# Patient Record
Sex: Female | Born: 1970 | Race: White | Hispanic: No | Marital: Single | Smoking: Current every day smoker
Health system: Southern US, Community
[De-identification: ages and names within clinical notes are randomized; demographics above are authoritative.]

## PROBLEM LIST (undated history)

## (undated) DIAGNOSIS — I10 Essential (primary) hypertension: Secondary | ICD-10-CM

## (undated) DIAGNOSIS — N390 Urinary tract infection, site not specified: Secondary | ICD-10-CM

## (undated) DIAGNOSIS — C539 Malignant neoplasm of cervix uteri, unspecified: Secondary | ICD-10-CM

## (undated) HISTORY — PX: ANAL FISSURE REPAIR: SHX2312

---
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
## 2004-12-19 ENCOUNTER — Emergency Department: Payer: Self-pay | Admitting: Emergency Medicine

## 2006-11-28 ENCOUNTER — Emergency Department: Payer: Self-pay | Admitting: Emergency Medicine

## 2007-04-26 ENCOUNTER — Emergency Department: Payer: Self-pay | Admitting: Emergency Medicine

## 2007-09-11 ENCOUNTER — Emergency Department: Payer: Self-pay | Admitting: Emergency Medicine

## 2007-10-29 ENCOUNTER — Emergency Department: Payer: Self-pay | Admitting: Emergency Medicine

## 2009-08-12 ENCOUNTER — Emergency Department: Payer: Self-pay | Admitting: Emergency Medicine

## 2010-02-09 ENCOUNTER — Ambulatory Visit: Payer: Self-pay | Admitting: Family Medicine

## 2010-05-24 NOTE — Assessment & Plan Note (Signed)
Summary: FLU SHOT/EVM  Nurse Visit    Immunizations Administered:  Influenza Vaccine:    Vaccine Type: FLUMIST    Site: NASAL    Mfr: MEDIMMUNE    Dose: 0.2mg     Route: INTRANASAL    Given by: Levonne Spiller EMT-P    Exp. Date: 03/20/2010    Lot #: [id number]    VIS given: 11/16/09 version given February 09, 2010.   Immunizations Administered:  Influenza Vaccine:    Vaccine Type: FLUMIST    Site: NASAL    Mfr: MEDIMMUNE    Dose: 0.2mg     Route: INTRANASAL    Given by: Levonne Spiller EMT-P    Exp. Date: 03/20/2010    Lot #: [id number]    VIS given: 11/16/09 version given February 09, 2010.  Flu Vaccine Consent Questions:    Do you have a history of severe allergic reactions to this vaccine? no    Any prior history of allergic reactions to egg and/or gelatin? no    Do you have a sensitivity to the preservative Thimersol? no    Do you have a past history of Guillan-Barre Syndrome? no    Do you currently have an acute febrile illness? no    Have you ever had a severe reaction to latex? no    Vaccine information given and explained to patient? yes    Are you currently pregnant? no

## 2011-04-27 ENCOUNTER — Emergency Department: Payer: Self-pay | Admitting: Emergency Medicine

## 2011-04-28 LAB — URINALYSIS, COMPLETE
Bilirubin,UR: NEGATIVE
Glucose,UR: NEGATIVE mg/dL (ref 0–75)
Nitrite: POSITIVE
Ph: 5 (ref 4.5–8.0)
Protein: 100
RBC,UR: 22 /HPF (ref 0–5)
Specific Gravity: 1.027 (ref 1.003–1.030)
Squamous Epithelial: 41

## 2013-07-24 ENCOUNTER — Emergency Department: Payer: Self-pay | Admitting: Emergency Medicine

## 2013-07-24 LAB — URINALYSIS, COMPLETE
Bacteria: NONE SEEN
Bilirubin,UR: NEGATIVE
Glucose,UR: NEGATIVE mg/dL (ref 0–75)
KETONE: NEGATIVE
Nitrite: NEGATIVE
PROTEIN: NEGATIVE
Ph: 6 (ref 4.5–8.0)
RBC,UR: 5 /HPF (ref 0–5)
Specific Gravity: 1.005 (ref 1.003–1.030)
WBC UR: 46 /HPF (ref 0–5)

## 2013-07-26 LAB — URINE CULTURE

## 2013-07-27 ENCOUNTER — Emergency Department: Payer: Self-pay | Admitting: Emergency Medicine

## 2013-07-27 LAB — URINALYSIS, COMPLETE
Bacteria: NONE SEEN
Bilirubin,UR: NEGATIVE
Blood: NEGATIVE
Glucose,UR: NEGATIVE mg/dL (ref 0–75)
Ketone: NEGATIVE
Leukocyte Esterase: NEGATIVE
Nitrite: NEGATIVE
Ph: 6 (ref 4.5–8.0)
Protein: NEGATIVE
RBC,UR: 6 /HPF (ref 0–5)
Specific Gravity: 1.014 (ref 1.003–1.030)
Squamous Epithelial: 4
WBC UR: <1 /HPF (ref 0–5)

## 2013-07-27 LAB — BASIC METABOLIC PANEL
Anion Gap: 7 (ref 7–16)
BUN: 8 mg/dL (ref 7–18)
Calcium, Total: 8.4 mg/dL — ABNORMAL LOW (ref 8.5–10.1)
Chloride: 108 mmol/L — ABNORMAL HIGH (ref 98–107)
Co2: 24 mmol/L (ref 21–32)
Creatinine: 0.94 mg/dL (ref 0.60–1.30)
EGFR (African American): >60
EGFR (Non-African Amer.): >60
Glucose: 108 mg/dL — ABNORMAL HIGH (ref 65–99)
Osmolality: 276 (ref 275–301)
Potassium: 3.6 mmol/L (ref 3.5–5.1)
Sodium: 139 mmol/L (ref 136–145)

## 2013-07-27 LAB — CBC
HCT: 39.4 % (ref 35.0–47.0)
HGB: 13.1 g/dL (ref 12.0–16.0)
MCH: 30.9 pg (ref 26.0–34.0)
MCHC: 33.3 g/dL (ref 32.0–36.0)
MCV: 93 fL (ref 80–100)
PLATELETS: 288 10*3/uL (ref 150–440)
RBC: 4.24 10*6/uL (ref 3.80–5.20)
RDW: 13.2 % (ref 11.5–14.5)
WBC: 10.5 10*3/uL (ref 3.6–11.0)

## 2013-07-28 LAB — PREGNANCY, URINE: Pregnancy Test, Urine: NEGATIVE m[IU]/mL

## 2013-07-30 ENCOUNTER — Emergency Department: Payer: Self-pay | Admitting: Emergency Medicine

## 2013-07-30 LAB — CBC
HCT: 39.3 % (ref 35.0–47.0)
HGB: 13.4 g/dL (ref 12.0–16.0)
MCH: 31.4 pg (ref 26.0–34.0)
MCHC: 34 g/dL (ref 32.0–36.0)
MCV: 92 fL (ref 80–100)
Platelet: 277 10*3/uL (ref 150–440)
RBC: 4.26 10*6/uL (ref 3.80–5.20)
RDW: 13.4 % (ref 11.5–14.5)
WBC: 8 10*3/uL (ref 3.6–11.0)

## 2013-07-30 LAB — URINALYSIS, COMPLETE
Bilirubin,UR: NEGATIVE
GLUCOSE, UR: NEGATIVE mg/dL (ref 0–75)
Ketone: NEGATIVE
Leukocyte Esterase: NEGATIVE
Nitrite: NEGATIVE
PROTEIN: NEGATIVE
Ph: 5 (ref 4.5–8.0)
RBC,UR: 6 /HPF (ref 0–5)
SPECIFIC GRAVITY: 1.023 (ref 1.003–1.030)

## 2013-07-30 LAB — COMPREHENSIVE METABOLIC PANEL
ALK PHOS: 68 U/L
ANION GAP: 6 — AB (ref 7–16)
Albumin: 3.5 g/dL (ref 3.4–5.0)
BILIRUBIN TOTAL: 0.4 mg/dL (ref 0.2–1.0)
BUN: 10 mg/dL (ref 7–18)
CO2: 22 mmol/L (ref 21–32)
Calcium, Total: 8.4 mg/dL — ABNORMAL LOW (ref 8.5–10.1)
Chloride: 108 mmol/L — ABNORMAL HIGH (ref 98–107)
Creatinine: 0.66 mg/dL (ref 0.60–1.30)
Glucose: 125 mg/dL — ABNORMAL HIGH (ref 65–99)
Osmolality: 272 (ref 275–301)
Potassium: 4.4 mmol/L (ref 3.5–5.1)
SGOT(AST): 23 U/L (ref 15–37)
SGPT (ALT): 20 U/L (ref 12–78)
SODIUM: 136 mmol/L (ref 136–145)
Total Protein: 7.4 g/dL (ref 6.4–8.2)

## 2013-10-03 ENCOUNTER — Emergency Department: Payer: Self-pay | Admitting: Emergency Medicine

## 2013-10-03 LAB — COMPREHENSIVE METABOLIC PANEL
Albumin: 3.4 g/dL (ref 3.4–5.0)
Alkaline Phosphatase: 65 U/L
Anion Gap: 6 — ABNORMAL LOW (ref 7–16)
BILIRUBIN TOTAL: 0.4 mg/dL (ref 0.2–1.0)
BUN: 14 mg/dL (ref 7–18)
CALCIUM: 8.3 mg/dL — AB (ref 8.5–10.1)
CREATININE: 1.11 mg/dL (ref 0.60–1.30)
Chloride: 109 mmol/L — ABNORMAL HIGH (ref 98–107)
Co2: 24 mmol/L (ref 21–32)
EGFR (African American): >60
GLUCOSE: 124 mg/dL — AB (ref 65–99)
Osmolality: 279 (ref 275–301)
Potassium: 3.1 mmol/L — ABNORMAL LOW (ref 3.5–5.1)
SGOT(AST): 15 U/L (ref 15–37)
SGPT (ALT): 15 U/L (ref 12–78)
SODIUM: 139 mmol/L (ref 136–145)
TOTAL PROTEIN: 6.5 g/dL (ref 6.4–8.2)

## 2013-10-04 LAB — URINALYSIS, COMPLETE
BILIRUBIN, UR: NEGATIVE
Bacteria: NONE SEEN
GLUCOSE, UR: NEGATIVE mg/dL (ref 0–75)
Ketone: NEGATIVE
NITRITE: NEGATIVE
Ph: 6 (ref 4.5–8.0)
Protein: NEGATIVE
SPECIFIC GRAVITY: 1.023 (ref 1.003–1.030)
Squamous Epithelial: 4

## 2013-10-04 LAB — CBC WITH DIFFERENTIAL/PLATELET
Basophil #: 0.1 10*3/uL (ref 0.0–0.1)
Basophil %: 0.5 %
Eosinophil #: 0.1 10*3/uL (ref 0.0–0.7)
Eosinophil %: 1.1 %
HCT: 38.2 % (ref 35.0–47.0)
HGB: 12.9 g/dL (ref 12.0–16.0)
Lymphocyte #: 4.5 10*3/uL — ABNORMAL HIGH (ref 1.0–3.6)
Lymphocyte %: 42.2 %
MCH: 31.5 pg (ref 26.0–34.0)
MCHC: 33.8 g/dL (ref 32.0–36.0)
MCV: 93 fL (ref 80–100)
MONOS PCT: 5.6 %
Monocyte #: 0.6 x10 3/mm (ref 0.2–0.9)
Neutrophil #: 5.3 10*3/uL (ref 1.4–6.5)
Neutrophil %: 50.6 %
PLATELETS: 258 10*3/uL (ref 150–440)
RBC: 4.1 10*6/uL (ref 3.80–5.20)
RDW: 13.7 % (ref 11.5–14.5)
WBC: 10.5 10*3/uL (ref 3.6–11.0)

## 2013-10-04 LAB — GC/CHLAMYDIA PROBE AMP

## 2013-10-04 LAB — WET PREP, GENITAL

## 2013-11-29 ENCOUNTER — Emergency Department: Payer: Self-pay | Admitting: Emergency Medicine

## 2013-11-29 LAB — URINALYSIS, COMPLETE
Bacteria: NONE SEEN
Bilirubin,UR: NEGATIVE
Glucose,UR: NEGATIVE mg/dL (ref 0–75)
KETONE: NEGATIVE
Leukocyte Esterase: NEGATIVE
NITRITE: NEGATIVE
PH: 6 (ref 4.5–8.0)
PROTEIN: NEGATIVE
RBC,UR: 6 /HPF (ref 0–5)
Specific Gravity: 1.017 (ref 1.003–1.030)
WBC UR: 1 /HPF (ref 0–5)

## 2013-11-29 LAB — GC/CHLAMYDIA PROBE AMP

## 2013-11-29 LAB — WET PREP, GENITAL

## 2013-12-01 ENCOUNTER — Ambulatory Visit: Payer: Self-pay

## 2013-12-01 LAB — URINE CULTURE

## 2013-12-08 ENCOUNTER — Ambulatory Visit: Payer: Self-pay

## 2013-12-10 ENCOUNTER — Ambulatory Visit: Payer: Self-pay

## 2014-05-25 ENCOUNTER — Emergency Department: Payer: Self-pay | Admitting: Emergency Medicine

## 2014-06-10 ENCOUNTER — Ambulatory Visit: Payer: Self-pay

## 2014-08-07 ENCOUNTER — Other Ambulatory Visit: Payer: Self-pay | Admitting: Oncology

## 2014-08-07 DIAGNOSIS — N6320 Unspecified lump in the left breast, unspecified quadrant: Secondary | ICD-10-CM

## 2014-08-17 ENCOUNTER — Emergency Department
Admit: 2014-08-17 | Disposition: A | Discharge status: Home / Self Care | Payer: Self-pay | Admitting: Emergency Medicine

## 2014-12-09 ENCOUNTER — Other Ambulatory Visit: Payer: Self-pay

## 2014-12-09 ENCOUNTER — Ambulatory Visit: Payer: Self-pay

## 2014-12-22 ENCOUNTER — Encounter: Payer: Self-pay | Admitting: Emergency Medicine

## 2014-12-22 ENCOUNTER — Emergency Department
Admission: EM | Admit: 2014-12-22 | Discharge: 2014-12-22 | Disposition: A | Discharge status: Home / Self Care | Payer: Self-pay | Attending: Emergency Medicine | Admitting: Emergency Medicine

## 2014-12-22 DIAGNOSIS — N390 Urinary tract infection, site not specified: Secondary | ICD-10-CM | POA: Insufficient documentation

## 2014-12-22 DIAGNOSIS — Z720 Tobacco use: Secondary | ICD-10-CM | POA: Insufficient documentation

## 2014-12-22 HISTORY — DX: Malignant neoplasm of cervix uteri, unspecified: C53.9

## 2014-12-22 HISTORY — DX: Urinary tract infection, site not specified: N39.0

## 2014-12-22 LAB — URINALYSIS COMPLETE WITH MICROSCOPIC (ARMC ONLY)
BACTERIA UA: NONE SEEN
Bilirubin Urine: NEGATIVE
Glucose, UA: NEGATIVE mg/dL
Ketones, ur: NEGATIVE mg/dL
NITRITE: POSITIVE — AB
PH: 6 (ref 5.0–8.0)
Protein, ur: NEGATIVE mg/dL
Specific Gravity, Urine: 1.003 — ABNORMAL LOW (ref 1.005–1.030)

## 2014-12-22 MED ORDER — CEPHALEXIN 500 MG PO CAPS: 500.0000 mg | ORAL_CAPSULE | Freq: Two times a day (BID) | ORAL | Status: AC

## 2014-12-22 NOTE — ED Provider Notes (Signed)
[facility] Emergency Department Provider Note ____________________________________________  Time seen: 1602  I have reviewed the triage vital signs and the nursing notes.  HISTORY  Chief Complaint  Urinary Frequency  HPI Stephanie Franklin is a 44 y.o. female was to the ED with urinary frequency and bladder pressure since this morning. She denies any nausea, vomiting, flank pain, fevers, chills, or sweats. She also denies any frank hematuria  Past Medical History  Diagnosis Date  . Cervical ca   . UTI (urinary tract infection)    There are no active problems to display for this patient.  Past Surgical History  Procedure Laterality Date  . Cesarean section    . Anal fissure repair     Current Outpatient Rx  Name  Route  Sig  Dispense  Refill  . cephALEXin (KEFLEX) 500 MG capsule   Oral   Take 1 capsule (500 mg total) by mouth 2 (two) times daily.   14 capsule   0     Allergies Review of patient's allergies indicates no known allergies.  History reviewed. No pertinent family history.  Social History Social History  Substance Use Topics  . Smoking status: Current Every Day Smoker  . Smokeless tobacco: None  . Alcohol Use: No   Review of Systems  Constitutional: Negative for fever. Eyes: Negative for visual changes. ENT: Negative for sore throat. Cardiovascular: Negative for chest pain. Respiratory: Negative for shortness of breath. Gastrointestinal: Negative for abdominal pain, vomiting and diarrhea. Genitourinary: Negative for dysuria. Reports urinary frequency.  Musculoskeletal: Negative for back pain. Skin: Negative for rash. Neurological: Negative for headaches, focal weakness or numbness. ____________________________________________  PHYSICAL EXAM:  VITAL SIGNS: ED Triage Vitals  Enc Vitals Group     BP 12/22/14 1441 167/90 mmHg     Pulse Rate 12/22/14 1441 100     Resp 12/22/14 1441 18     Temp 12/22/14 1441 98.1 F  (36.7 C)     Temp Source 12/22/14 1441 Oral     SpO2 12/22/14 1441 95 %     Weight 12/22/14 1441 170 lb (77.111 kg)     Height 12/22/14 1441 5\' 1"  (1.549 m)     Head Cir --      Peak Flow --      Pain Score 12/22/14 1442 0     Pain Loc --      Pain Edu? --      Excl. in [place]? --    Constitutional: Alert and oriented. Well appearing and in no distress. Eyes: Conjunctivae are normal. PERRL. Normal extraocular movements. ENT   Head: Normocephalic and atraumatic.   Nose: No congestion/rhinnorhea.   Mouth/Throat: Mucous membranes are moist.   Neck: Supple. No thyromegaly. Hematological/Lymphatic/Immunilogical: No cervical lymphadenopathy. Cardiovascular: Normal rate, regular rhythm.  Respiratory: Normal respiratory effort. No wheezes/rales/rhonchi. Gastrointestinal: Soft and nontender. No distention. Musculoskeletal: Nontender with normal range of motion in all extremities.  Neurologic:  Normal gait without ataxia. Normal speech and language. No gross focal neurologic deficits are appreciated. Skin:  Skin is warm, dry and intact. No rash noted. Psychiatric: Mood and affect are normal. Patient exhibits appropriate insight and judgment. ____________________________________________   LABS (pertinent positives/negatives) Labs Reviewed  URINALYSIS COMPLETEWITH MICROSCOPIC ([facility] ONLY) - Abnormal; Notable for the following:    Color, Urine AMBER (*)    APPearance CLEAR (*)    Specific Gravity, Urine 1.003 (*)    Hgb urine dipstick 2+ (*)    Nitrite POSITIVE (*)  Leukocytes, UA TRACE (*)    Squamous Epithelial / LPF 0-5 (*)    All other components within normal limits  ____________________________________________  INITIAL IMPRESSION / ASSESSMENT AND PLAN / ED COURSE  UTI confirmed by urinalysis. Treatment with Keflex x 7 days. Follow-up with primary provider as needed.  ____________________________________________  FINAL CLINICAL IMPRESSION(S) / ED DIAGNOSES  Final  diagnoses:  UTI (lower urinary tract infection)     Melvenia Needles, PA-C 12/22/14 1621  Orbie Pyo, MD 12/26/14 1600

## 2014-12-22 NOTE — Discharge Instructions (Signed)

## 2014-12-22 NOTE — ED Notes (Signed)
Reports urinary frequency and pressure onset this am.  Skin w/d, denies abd pain.

## 2014-12-30 ENCOUNTER — Ambulatory Visit: Payer: Self-pay | Attending: Oncology | Admitting: *Deleted

## 2014-12-30 ENCOUNTER — Encounter: Payer: Self-pay | Admitting: *Deleted

## 2014-12-30 ENCOUNTER — Encounter (INDEPENDENT_AMBULATORY_CARE_PROVIDER_SITE_OTHER): Payer: Self-pay

## 2014-12-30 ENCOUNTER — Other Ambulatory Visit: Payer: Self-pay | Admitting: Oncology

## 2014-12-30 ENCOUNTER — Other Ambulatory Visit: Payer: Self-pay | Admitting: *Deleted

## 2014-12-30 ENCOUNTER — Ambulatory Visit
Admission: RE | Admit: 2014-12-30 | Discharge: 2014-12-30 | Disposition: A | Discharge status: Home / Self Care | Payer: Self-pay | Source: Ambulatory Visit | Attending: Oncology | Admitting: Oncology

## 2014-12-30 VITALS — BP 138/96 | HR 103 | Temp 97.3°F | Ht 62.0 in | Wt 174.2 lb

## 2014-12-30 DIAGNOSIS — N6320 Unspecified lump in the left breast, unspecified quadrant: Secondary | ICD-10-CM

## 2014-12-30 DIAGNOSIS — Z0000 Encounter for general adult medical examination without abnormal findings: Secondary | ICD-10-CM

## 2014-12-30 DIAGNOSIS — N63 Unspecified lump in unspecified breast: Secondary | ICD-10-CM

## 2014-12-30 NOTE — Progress Notes (Signed)
Subjective:     Patient ID: Stephanie Franklin, female   DOB: May 21, 1970, 44 y.o.   MRN: [id number]  HPI   Review of Systems     Objective:   Physical Exam  Pulmonary/Chest: Right breast exhibits no inverted nipple, no mass, no nipple discharge, no skin change and no tenderness. Left breast exhibits no inverted nipple, no mass, no nipple discharge, no skin change and no tenderness. Breasts are symmetrical.  Slight induration noted under bilateral nipples  Abdominal: There is no splenomegaly or hepatomegaly.    Genitourinary: No labial fusion. There is no rash, tenderness, lesion or injury on the right labia. There is no rash, tenderness, lesion or injury on the left labia. Cervix exhibits no motion tenderness and no friability. Right adnexum displays no mass, no tenderness and no fullness. Left adnexum displays no mass, no tenderness and no fullness. No erythema, tenderness or bleeding in the vagina. No foreign body around the vagina. No signs of injury around the vagina. No vaginal discharge found.    Vaginal dryness noted       Assessment:     44 year old white female returns to [facility] for annual screening.  Clinical breast exam unremarkable.  Taught self breast awareness.  Last mammo 06/10/14 birads 3.  Due for 6 month follow up and annual exam.  Patient with history of cervical cancer.  Vaginal dryness noted.  Discussed menopausal symptoms.  Patient has been screened for eligibility.  She does not have any insurance, Medicare or Medicaid.  She also meets financial eligibility.  Hand-out given on the Affordable Care Act.    Plan:     Bilateral diagnostic mammogram and ultrasound ordered.  Will follow up per protocol.  To try a vaginal moisturized and lubricant.  She is agreeable.  Will follow up per protocol.

## 2014-12-30 NOTE — Patient Instructions (Signed)
Gave patient hand-out, Women Staying Healthy, Active and Well from [facility], with education on breast health, pap smears, heart and colon health. 

## 2015-01-04 ENCOUNTER — Encounter: Payer: Self-pay | Admitting: *Deleted

## 2015-01-04 NOTE — Progress Notes (Signed)
Letter mailed to inform patient of her next appointment for [facility] and diagnostic mammogram on 01/03/16 @ 8:00.

## 2015-01-30 ENCOUNTER — Encounter: Payer: Self-pay | Admitting: *Deleted

## 2015-01-30 ENCOUNTER — Emergency Department
Admission: EM | Admit: 2015-01-30 | Discharge: 2015-01-30 | Disposition: A | Discharge status: Home / Self Care | Payer: Self-pay | Attending: Emergency Medicine | Admitting: Emergency Medicine

## 2015-01-30 ENCOUNTER — Emergency Department: Payer: Self-pay

## 2015-01-30 DIAGNOSIS — R109 Unspecified abdominal pain: Secondary | ICD-10-CM | POA: Insufficient documentation

## 2015-01-30 DIAGNOSIS — Z720 Tobacco use: Secondary | ICD-10-CM | POA: Insufficient documentation

## 2015-01-30 DIAGNOSIS — E669 Obesity, unspecified: Secondary | ICD-10-CM | POA: Insufficient documentation

## 2015-01-30 DIAGNOSIS — Z792 Long term (current) use of antibiotics: Secondary | ICD-10-CM | POA: Insufficient documentation

## 2015-01-30 DIAGNOSIS — Z3202 Encounter for pregnancy test, result negative: Secondary | ICD-10-CM | POA: Insufficient documentation

## 2015-01-30 LAB — BASIC METABOLIC PANEL
Anion gap: 9 (ref 5–15)
BUN: 12 mg/dL (ref 6–20)
CHLORIDE: 107 mmol/L (ref 101–111)
CO2: 23 mmol/L (ref 22–32)
CREATININE: 0.91 mg/dL (ref 0.44–1.00)
Calcium: 8.7 mg/dL — ABNORMAL LOW (ref 8.9–10.3)
GFR calc non Af Amer: >60 mL/min (ref >60)
Glucose, Bld: 119 mg/dL — ABNORMAL HIGH (ref 65–99)
POTASSIUM: 3.6 mmol/L (ref 3.5–5.1)
SODIUM: 139 mmol/L (ref 135–145)

## 2015-01-30 LAB — URINALYSIS COMPLETE WITH MICROSCOPIC (ARMC ONLY)
BILIRUBIN URINE: NEGATIVE
Bacteria, UA: NONE SEEN
Glucose, UA: NEGATIVE mg/dL
KETONES UR: NEGATIVE mg/dL
LEUKOCYTES UA: NEGATIVE
Nitrite: NEGATIVE
PH: 5 (ref 5.0–8.0)
PROTEIN: NEGATIVE mg/dL
SPECIFIC GRAVITY, URINE: 1.008 (ref 1.005–1.030)

## 2015-01-30 LAB — CBC
HCT: 40 % (ref 35.0–47.0)
Hemoglobin: 13.9 g/dL (ref 12.0–16.0)
MCH: 31.7 pg (ref 26.0–34.0)
MCHC: 34.8 g/dL (ref 32.0–36.0)
MCV: 91.1 fL (ref 80.0–100.0)
PLATELETS: 275 10*3/uL (ref 150–440)
RBC: 4.39 MIL/uL (ref 3.80–5.20)
RDW: 13.6 % (ref 11.5–14.5)
WBC: 10.2 10*3/uL (ref 3.6–11.0)

## 2015-01-30 LAB — POCT PREGNANCY, URINE: Preg Test, Ur: NEGATIVE

## 2015-01-30 MED ORDER — ONDANSETRON HCL 4 MG/2ML IJ SOLN
4.0000 mg | Freq: Once | INTRAMUSCULAR | Status: AC
  Administered 2015-01-30: 4 mg via INTRAVENOUS
  Filled 2015-01-30: qty 2

## 2015-01-30 MED ORDER — KETOROLAC TROMETHAMINE 30 MG/ML IJ SOLN
30.0000 mg | Freq: Once | INTRAMUSCULAR | Status: AC
  Administered 2015-01-30: 30 mg via INTRAVENOUS
  Filled 2015-01-30: qty 1

## 2015-01-30 MED ORDER — HYDROMORPHONE HCL 1 MG/ML IJ SOLN
0.5000 mg | Freq: Once | INTRAMUSCULAR | Status: AC
  Administered 2015-01-30: 0.5 mg via INTRAVENOUS
  Filled 2015-01-30: qty 1

## 2015-01-30 MED ORDER — OXYCODONE-ACETAMINOPHEN 5-325 MG PO TABS: 1.0000 | ORAL_TABLET | Freq: Four times a day (QID) | ORAL | Status: AC | PRN

## 2015-01-30 MED ORDER — IBUPROFEN 800 MG PO TABS
800.0000 mg | ORAL_TABLET | Freq: Three times a day (TID) | ORAL | Status: DC | PRN
Start: 2015-01-30 — End: 2017-09-19

## 2015-01-30 MED ORDER — SODIUM CHLORIDE 0.9 % IV BOLUS (SEPSIS)
1000.0000 mL | Freq: Once | INTRAVENOUS | Status: AC
  Administered 2015-01-30: 1000 mL via INTRAVENOUS
  Filled 2015-01-30: qty 1000

## 2015-01-30 NOTE — ED Provider Notes (Addendum)
[facility] Emergency Department Provider Note  ____________________________________________  Time seen: Approximately 7:49 PM  I have reviewed the triage vital signs and the nursing notes.   HISTORY  Chief Complaint Flank Pain    HPI Stephanie Franklin is a 44 y.o. female, s/p btl, with a history of renal stones, remote cervical cancer, recurrent UTI resenting with left flank pain. Patient reports acute onset of a "sharp" left flank pain after baking a cake approximate 4-5 hours ago. Pain is occasionally worse with positional changes. No associated nausea, vomiting, diarrhea, fever, chills.  Sexually active; no change in vaginal discharge.LMP 9/19.   Past Medical History  Diagnosis Date  . UTI (urinary tract infection)   . Cervical ca ([place]) 1990    There are no active problems to display for this patient.   Past Surgical History  Procedure Laterality Date  . Cesarean section    . Anal fissure repair      Current Outpatient Rx  Name  Route  Sig  Dispense  Refill  . cephALEXin (KEFLEX) 500 MG capsule   Oral   Take 1 capsule (500 mg total) by mouth 2 (two) times daily.   14 capsule   0     Allergies Review of patient's allergies indicates no known allergies.  Family History  Problem Relation Age of Onset  . Breast cancer Mother     26's    Social History Social History  Substance Use Topics  . Smoking status: Current Every Day Smoker  . Smokeless tobacco: None  . Alcohol Use: No    Review of Systems Constitutional: No fever/chills area and no lightheadedness. Eyes: No visual changes. ENT: No sore throat. Cardiovascular: Denies chest pain, palpitations. Respiratory: Denies shortness of breath.  No cough. Gastrointestinal: No abdominal pain.  No nausea, no vomiting.  No diarrhea.  No constipation. Plus left flank pain.  Genitourinary: Negative for dysuria, change in urinary frequency, hematuria. Musculoskeletal: Negative for  back pain. Skin: Negative for rash. Neurological: Negative for headaches, focal weakness or numbness.  10-point ROS otherwise negative.  ____________________________________________   PHYSICAL EXAM:  VITAL SIGNS: ED Triage Vitals  Enc Vitals Group     BP 01/30/15 1746 158/92 mmHg     Pulse Rate 01/30/15 1746 97     Resp 01/30/15 1746 20     Temp 01/30/15 1746 98.3 F (36.8 C)     Temp Source 01/30/15 1746 Oral     SpO2 01/30/15 1746 98 %     Weight 01/30/15 1746 174 lb (78.926 kg)     Height 01/30/15 1746 5' (1.524 m)     Head Cir --      Peak Flow --      Pain Score 01/30/15 1747 10     Pain Loc --      Pain Edu? --      Excl. in [place]? --     Constitutional: Alert and oriented. Mildly uncomfortable appearing. Answer question appropriately. Eyes: Conjunctivae are normal.  EOMI. Head: Atraumatic. Nose: No congestion/rhinnorhea. Mouth/Throat: Mucous membranes are moist.  Neck: No stridor.  Supple.   Cardiovascular: Normal rate, regular rhythm. No murmurs, rubs or gallops.  Respiratory: Normal respiratory effort.  No retractions. Lungs CTAB.  No wheezes, rales or ronchi. Gastrointestinal: Obese. Soft and diffusely tender in the left side without focality. No distention. No peritoneal signs. Musculoskeletal: No LE edema.  Neurologic:  Normal speech and language. No gross focal neurologic deficits are appreciated.  Skin:  Skin is warm, dry and intact. No rash noted. Psychiatric: Mood and affect are normal. Speech and behavior are normal.  Normal judgement.  ____________________________________________   LABS (all labs ordered are listed, but only abnormal results are displayed)  Labs Reviewed  URINALYSIS COMPLETEWITH MICROSCOPIC ([place]) - Abnormal; Notable for the following:    Color, Urine STRAW (*)    APPearance CLEAR (*)    Hgb urine dipstick 2+ (*)    Squamous Epithelial / LPF 0-5 (*)    All other components within normal limits  BASIC METABOLIC PANEL -  Abnormal; Notable for the following:    Glucose, Bld 119 (*)    Calcium 8.7 (*)    All other components within normal limits  CBC  POC URINE PREG, ED  POCT PREGNANCY, URINE   ____________________________________________  EKG  Not indicated ____________________________________________  RADIOLOGY  No results found.  ____________________________________________   PROCEDURES  Procedure(s) performed: None  Critical Care performed: No ____________________________________________   INITIAL IMPRESSION / ASSESSMENT AND PLAN / ED COURSE  Pertinent labs & imaging results that were available during my care of the patient were reviewed by me and considered in my medical decision making (see chart for details).  44 y.o. female with a history of recurrent UTIs and renal colic presenting with acute onset of left flank pain. The patient is well-appearing with stable vital signs, afebrile. Consider renal stone, diverticulitis, less likely ovarian torsion or ovarian cyst. Will initiate symptomatically treatment and get a CT scan for further evaluation  ----------------------------------------- 8:43 PM on 01/30/2015 -----------------------------------------  The patient is not pregnant, does not have signs of UTI, and CT scan does not show a stone or any other causes for the patient's pain. At this time, the patient has a reassuring exam and is feeling better. I have talked to her about return precautions and follow-up instructions with a primary care physician. ____________________________________________  FINAL CLINICAL IMPRESSION(S) / ED DIAGNOSES  Final diagnoses:  Left flank pain      NEW MEDICATIONS STARTED DURING THIS VISIT:  New Prescriptions   No medications on file     Eula Listen, MD 01/30/15 2044  Eula Listen, MD 01/30/15 2047

## 2015-01-30 NOTE — Discharge Instructions (Signed)
Flank Pain Flank pain refers to pain that is located on the side of the body between the upper abdomen and the back. The pain may occur over a short period of time (acute) or may be long-term or reoccurring (chronic). It may be mild or severe. Flank pain can be caused by many things. CAUSES  Some of the more common causes of flank pain include:  Muscle strains.   Muscle spasms.   A disease of your spine (vertebral disk disease).   A lung infection (pneumonia).   Fluid around your lungs (pulmonary edema).   A kidney infection.   Kidney stones.   A very painful skin rash caused by the chickenpox virus (shingles).   Gallbladder disease.  [place] care will depend on the cause of your pain. In general,  Rest as directed by your caregiver.  Drink enough fluids to keep your urine clear or pale yellow.  Only take over-the-counter or prescription medicines as directed by your caregiver. Some medicines may help relieve the pain.  Tell your caregiver about any changes in your pain.  Follow up with your caregiver as directed. SEEK IMMEDIATE MEDICAL CARE IF:   Your pain is not controlled with medicine.   You have new or worsening symptoms.  Your pain increases.   You have abdominal pain.   You have shortness of breath.   You have persistent nausea or vomiting.   You have swelling in your abdomen.   You feel faint or pass out.   You have blood in your urine.  You have a fever or persistent symptoms for more than 2-3 days.  You have a fever and your symptoms suddenly get worse. MAKE SURE YOU:   Understand these instructions.  Will watch your condition.  Will get help right away if you are not doing well or get worse.   This information is not intended to replace advice given to you by your health care provider. Make sure you discuss any questions you have with your health care provider.   Document Released: 06/01/2005 Document  Revised: 01/03/2012 Document Reviewed: 11/23/2011 Elsevier Interactive Patient Education 2016 [employer].   Please drink plenty of fluid to stay well hydrated. Please take Motrin for mild to moderate pain and Percocet for severe pain. Do not drive within 8 hours of taking Percocet.  Please return to the emergency department if he develops severe pain, fever, inability to keep down fluids, or any other symptoms concerning to you.

## 2015-01-30 NOTE — ED Notes (Signed)
Pt states sudden onset of left sided flank pain, denies any problems urinating

## 2015-07-01 ENCOUNTER — Encounter: Payer: Self-pay | Admitting: *Deleted

## 2015-07-01 ENCOUNTER — Emergency Department
Admission: EM | Admit: 2015-07-01 | Discharge: 2015-07-01 | Disposition: A | Discharge status: Home / Self Care | Payer: Self-pay | Attending: Emergency Medicine | Admitting: Emergency Medicine

## 2015-07-01 DIAGNOSIS — Y998 Other external cause status: Secondary | ICD-10-CM | POA: Insufficient documentation

## 2015-07-01 DIAGNOSIS — F17200 Nicotine dependence, unspecified, uncomplicated: Secondary | ICD-10-CM | POA: Insufficient documentation

## 2015-07-01 DIAGNOSIS — Z792 Long term (current) use of antibiotics: Secondary | ICD-10-CM | POA: Insufficient documentation

## 2015-07-01 DIAGNOSIS — S39012A Strain of muscle, fascia and tendon of lower back, initial encounter: Secondary | ICD-10-CM | POA: Insufficient documentation

## 2015-07-01 DIAGNOSIS — Y9289 Other specified places as the place of occurrence of the external cause: Secondary | ICD-10-CM | POA: Insufficient documentation

## 2015-07-01 DIAGNOSIS — Y9389 Activity, other specified: Secondary | ICD-10-CM | POA: Insufficient documentation

## 2015-07-01 DIAGNOSIS — Z3202 Encounter for pregnancy test, result negative: Secondary | ICD-10-CM | POA: Insufficient documentation

## 2015-07-01 DIAGNOSIS — X501XXA Overexertion from prolonged static or awkward postures, initial encounter: Secondary | ICD-10-CM | POA: Insufficient documentation

## 2015-07-01 LAB — CBC WITH DIFFERENTIAL/PLATELET
BASOS ABS: 0.1 10*3/uL (ref 0–0.1)
BASOS PCT: 1 %
EOS ABS: 0.1 10*3/uL (ref 0–0.7)
Eosinophils Relative: 1 %
HEMATOCRIT: 37.6 % (ref 35.0–47.0)
Hemoglobin: 12.6 g/dL (ref 12.0–16.0)
LYMPHS PCT: 39 %
Lymphs Abs: 3.5 10*3/uL (ref 1.0–3.6)
MCH: 30.7 pg (ref 26.0–34.0)
MCHC: 33.4 g/dL (ref 32.0–36.0)
MCV: 92 fL (ref 80.0–100.0)
MONO ABS: 0.5 10*3/uL (ref 0.2–0.9)
MONOS PCT: 6 %
NEUTROS ABS: 4.8 10*3/uL (ref 1.4–6.5)
Neutrophils Relative %: 53 %
Platelets: 262 10*3/uL (ref 150–440)
RBC: 4.09 MIL/uL (ref 3.80–5.20)
RDW: 14.3 % (ref 11.5–14.5)
WBC: 8.9 10*3/uL (ref 3.6–11.0)

## 2015-07-01 LAB — URINALYSIS COMPLETE WITH MICROSCOPIC (ARMC ONLY)
BILIRUBIN URINE: NEGATIVE
Bacteria, UA: NONE SEEN
Glucose, UA: NEGATIVE mg/dL
KETONES UR: NEGATIVE mg/dL
Leukocytes, UA: NEGATIVE
Nitrite: NEGATIVE
PROTEIN: NEGATIVE mg/dL
Specific Gravity, Urine: 1.013 (ref 1.005–1.030)
pH: 7 (ref 5.0–8.0)

## 2015-07-01 LAB — BASIC METABOLIC PANEL
Anion gap: 7 (ref 5–15)
BUN: 11 mg/dL (ref 6–20)
CO2: 24 mmol/L (ref 22–32)
CREATININE: 0.76 mg/dL (ref 0.44–1.00)
Calcium: 9 mg/dL (ref 8.9–10.3)
Chloride: 109 mmol/L (ref 101–111)
GFR calc Af Amer: >60 mL/min (ref >60)
Glucose, Bld: 103 mg/dL — ABNORMAL HIGH (ref 65–99)
Potassium: 3.4 mmol/L — ABNORMAL LOW (ref 3.5–5.1)
SODIUM: 140 mmol/L (ref 135–145)

## 2015-07-01 LAB — POCT PREGNANCY, URINE: PREG TEST UR: NEGATIVE

## 2015-07-01 MED ORDER — DIAZEPAM 5 MG PO TABS: 5.0000 mg | ORAL_TABLET | Freq: Three times a day (TID) | ORAL | Status: AC | PRN

## 2015-07-01 MED ORDER — DIAZEPAM 5 MG PO TABS
ORAL_TABLET | ORAL | Status: AC
  Administered 2015-07-01: 5 mg via ORAL
  Filled 2015-07-01: qty 1

## 2015-07-01 MED ORDER — OXYCODONE-ACETAMINOPHEN 5-325 MG PO TABS
ORAL_TABLET | ORAL | Status: AC
  Administered 2015-07-01: 1 via ORAL
  Filled 2015-07-01: qty 1

## 2015-07-01 MED ORDER — KETOROLAC TROMETHAMINE 30 MG/ML IJ SOLN
30.0000 mg | Freq: Once | INTRAMUSCULAR | Status: AC
  Administered 2015-07-01: 30 mg via INTRAMUSCULAR

## 2015-07-01 MED ORDER — DIAZEPAM 5 MG PO TABS
5.0000 mg | ORAL_TABLET | Freq: Once | ORAL | Status: AC
  Administered 2015-07-01: 5 mg via ORAL

## 2015-07-01 MED ORDER — KETOROLAC TROMETHAMINE 30 MG/ML IJ SOLN
INTRAMUSCULAR | Status: AC
Start: 2015-07-01 — End: 2015-07-01
  Administered 2015-07-01: 30 mg via INTRAMUSCULAR
  Filled 2015-07-01: qty 1

## 2015-07-01 MED ORDER — NAPROXEN 500 MG PO TABS: 500.0000 mg | ORAL_TABLET | Freq: Two times a day (BID) | ORAL | Status: AC

## 2015-07-01 MED ORDER — OXYCODONE-ACETAMINOPHEN 5-325 MG PO TABS
1.0000 | ORAL_TABLET | Freq: Once | ORAL | Status: AC
Start: 2015-07-01 — End: 2015-07-01
  Administered 2015-07-01: 1 via ORAL

## 2015-07-01 NOTE — ED Notes (Signed)
Pt to triage via wheelchair.  Pt reports pain in left flank. Pt states pain for 1 week and today pain became worse.   No n/v today.  Hx of kidney stone.  No hx of htn.  Pt alert.

## 2015-07-01 NOTE — ED Provider Notes (Signed)
[facility] Emergency Department Provider Note  ____________________________________________  Time seen: 8:35 PM  I have reviewed the triage vital signs and the nursing notes.   HISTORY  Chief Complaint Flank Pain    HPI Stephanie Franklin is a 45 y.o. female who complains of gradual onset worsening left flank pain and left lower back pain it's been getting worse over the past week. Worse with movement. She's been taking care of her 22-year-old grandchild and lifting him who weighs 23 pounds. 3 months ago she had a bad injury of her lower back at [facility] while moving a big case of meat and turning where she had severe left lower back pain which took a long time to heal and has just improved recently. No dysuria frequency urgency hematuria. No vomiting no trauma. Pain is severe, sharp, colicky, radiates around to the left lower abdomen.     Past Medical History  Diagnosis Date  . UTI (urinary tract infection)   . Cervical ca ([place]) 1990     There are no active problems to display for this patient.    Past Surgical History  Procedure Laterality Date  . Cesarean section    . Anal fissure repair       Current Outpatient Rx  Name  Route  Sig  Dispense  Refill  . cephALEXin (KEFLEX) 500 MG capsule   Oral   Take 1 capsule (500 mg total) by mouth 2 (two) times daily.   14 capsule   0   . diazepam (VALIUM) 5 MG tablet   Oral   Take 1 tablet (5 mg total) by mouth every 8 (eight) hours as needed for muscle spasms.   8 tablet   0   . ibuprofen (ADVIL,MOTRIN) 800 MG tablet   Oral   Take 1 tablet (800 mg total) by mouth every 8 (eight) hours as needed for moderate pain (with food).   20 tablet   0   . naproxen (NAPROSYN) 500 MG tablet   Oral   Take 1 tablet (500 mg total) by mouth 2 (two) times daily with a meal.   20 tablet   0   . oxyCODONE-acetaminophen (ROXICET) 5-325 MG tablet   Oral   Take 1 tablet by mouth every 6 (six) hours as  needed.   20 tablet   0      Allergies Review of patient's allergies indicates no known allergies.   Family History  Problem Relation Age of Onset  . Breast cancer Mother     93's    Social History Social History  Substance Use Topics  . Smoking status: Current Every Day Smoker  . Smokeless tobacco: None  . Alcohol Use: No    Review of Systems  Constitutional:   No fever or chills. No weight changes Eyes:   No blurry vision or double vision.  ENT:   No sore throat.  Cardiovascular:   No chest pain. Respiratory:   No dyspnea or cough. Gastrointestinal:   Negative for abdominal pain, vomiting and diarrhea.  No BRBPR or melena. Genitourinary:   Negative for dysuria or difficulty urinating. Musculoskeletal:   Left lower back pain.. Skin:   Negative for rash. Neurological:   Negative for headaches, focal weakness or numbness. Psychiatric:  No anxiety or depression.   Endocrine:  No changes in energy or sleep difficulty.  10-point ROS otherwise negative.  ____________________________________________   PHYSICAL EXAM:  VITAL SIGNS: ED Triage Vitals  Enc Vitals Group  BP 07/01/15 1912 162/109 mmHg     Pulse Rate 07/01/15 1912 87     Resp 07/01/15 1912 20     Temp 07/01/15 1912 98.8 F (37.1 C)     Temp Source 07/01/15 1912 Oral     SpO2 07/01/15 1912 97 %     Weight 07/01/15 1912 175 lb (79.379 kg)     Height 07/01/15 1912 5' (1.524 m)     Head Cir --      Peak Flow --      Pain Score 07/01/15 1915 10     Pain Loc --      Pain Edu? --      Excl. in [place]? --     Vital signs reviewed, nursing assessments reviewed.   Constitutional:   Alert and oriented. Well appearing and in no distress. Eyes:   No scleral icterus. No conjunctival pallor. PERRL. EOMI ENT   Head:   Normocephalic and atraumatic.   Nose:   No congestion/rhinnorhea. No septal hematoma   Mouth/Throat:   MMM, no pharyngeal erythema. No peritonsillar mass.    Neck:   No  stridor. No SubQ emphysema. No meningismus. Hematological/Lymphatic/Immunilogical:   No cervical lymphadenopathy. Cardiovascular:   RRR. Symmetric bilateral radial and DP pulses.  No murmurs.  Respiratory:   Normal respiratory effort without tachypnea nor retractions. Breath sounds are clear and equal bilaterally. No wheezes/rales/rhonchi. Gastrointestinal:   Soft and nontender. Non distended. There is no CVA tenderness.  No rebound, rigidity, or guarding. Genitourinary:   deferred Musculoskeletal:   Very tender and tense in the left lower back pain in the paraspinous musculature. Worse with movement and change of position. Abdomen is soft nontender nondistended and benign. Straight leg raise negative bilaterally Neurologic:   Normal speech and language.  CN 2-10 normal. Motor grossly intact. No gross focal neurologic deficits are appreciated.  Skin:    Skin is warm, dry and intact. No rash noted.  No petechiae, purpura, or bullae. Psychiatric:   Mood and affect are normal. ____________________________________________    LABS (pertinent positives/negatives) (all labs ordered are listed, but only abnormal results are displayed) Labs Reviewed  BASIC METABOLIC PANEL - Abnormal; Notable for the following:    Potassium 3.4 (*)    Glucose, Bld 103 (*)    All other components within normal limits  URINALYSIS COMPLETEWITH MICROSCOPIC ([facility] ONLY) - Abnormal; Notable for the following:    Color, Urine STRAW (*)    APPearance CLEAR (*)    Hgb urine dipstick 1+ (*)    Squamous Epithelial / LPF 0-5 (*)    All other components within normal limits  CBC WITH DIFFERENTIAL/PLATELET  POC URINE PREG, ED  POCT PREGNANCY, URINE   ____________________________________________   EKG    ____________________________________________    RADIOLOGY    ____________________________________________   PROCEDURES   ____________________________________________   INITIAL IMPRESSION / ASSESSMENT  AND PLAN / ED COURSE  Pertinent labs & imaging results that were available during my care of the patient were reviewed by me and considered in my medical decision making (see chart for details).  Patient well appearing no acute distress. Vital signs unremarkable. Labs unremarkable urinalysis normal. Considering the patient's symptoms, medical history, and physical examination today, I have low suspicion for cholecystitis or biliary pathology, pancreatitis, perforation or bowel obstruction, hernia, intra-abdominal abscess, AAA or dissection, volvulus or intussusception, mesenteric ischemia, or appendicitis.  No evidence of any acute spinal injury or process such as epidural abscess or hematoma. No evidence of  osteomyelitis or discitis or cauda equina. Clinically this is very clearly musculoskeletal back pain with a re-aggravation of prior injury and muscle strain. Would treat with NSAIDs of benzos and follow-up with primary care. Back exercises.     ____________________________________________   FINAL CLINICAL IMPRESSION(S) / ED DIAGNOSES  Final diagnoses:  Lumbosacral strain, initial encounter      Carrie Mew, MD 07/01/15 2053

## 2015-07-01 NOTE — Discharge Instructions (Signed)
You were prescribed a medication that is potentially sedating. Do not drink alcohol, drive or participate in any other potentially dangerous activities while taking this medication as it may make you sleepy. Do not take this medication with any other sedating medications, either prescription or over-the-counter. If you were prescribed Percocet or Vicodin, do not take these with acetaminophen (Tylenol) as it is already contained within these medications.   Opioid pain medications (or "narcotics") can be habit forming.  Use it as little as possible to achieve adequate pain control.  Do not use or use it with extreme caution if you have a history of opiate abuse or dependence.  If you are on a pain contract with your primary care doctor or a pain specialist, be sure to let them know you were prescribed this medication today from the [facility].  This medication is intended for your use only - do not give any to anyone else and keep it in a secure place where nobody else, especially children and pets, have access to it.  It will also cause or worsen constipation, so you may want to consider taking an over-the-counter stool softener while you are taking this medication.  Back Exercises The following exercises strengthen the muscles that help to support the back. They also help to keep the lower back flexible. Doing these exercises can help to prevent back pain or lessen existing pain. If you have back pain or discomfort, try doing these exercises 2-3 times each day or as told by your health care provider. When the pain goes away, do them once each day, but increase the number of times that you repeat the steps for each exercise (do more repetitions). If you do not have back pain or discomfort, do these exercises once each day or as told by your health care provider. EXERCISES Single Knee to Chest Repeat these steps 3-5 times for each leg: 1. Lie on your back on a firm bed or the  floor with your legs extended. 2. Bring one knee to your chest. Your other leg should stay extended and in contact with the floor. 3. Hold your knee in place by grabbing your knee or thigh. 4. Pull on your knee until you feel a gentle stretch in your lower back. 5. Hold the stretch for 10-30 seconds. 6. Slowly release and straighten your leg. Pelvic Tilt Repeat these steps 5-10 times: 1. Lie on your back on a firm bed or the floor with your legs extended. 2. Bend your knees so they are pointing toward the ceiling and your feet are flat on the floor. 3. Tighten your lower abdominal muscles to press your lower back against the floor. This motion will tilt your pelvis so your tailbone points up toward the ceiling instead of pointing to your feet or the floor. 4. With gentle tension and even breathing, hold this position for 5-10 seconds. Cat-Cow Repeat these steps until your lower back becomes more flexible: 1. Get into a hands-and-knees position on a firm surface. Keep your hands under your shoulders, and keep your knees under your hips. You may place padding under your knees for comfort. 2. Let your head hang down, and point your tailbone toward the floor so your lower back becomes rounded like the back of a cat. 3. Hold this position for 5 seconds. 4. Slowly lift your head and point your tailbone up toward the ceiling so your back forms a sagging arch like the back of a cow. 5.  Hold this position for 5 seconds. Press-Ups Repeat these steps 5-10 times: 1. Lie on your abdomen (face-down) on the floor. 2. Place your palms near your head, about shoulder-width apart. 3. While you keep your back as relaxed as possible and keep your hips on the floor, slowly straighten your arms to raise the top half of your body and lift your shoulders. Do not use your back muscles to raise your upper torso. You may adjust the placement of your hands to make yourself more comfortable. 4. Hold this position for 5  seconds while you keep your back relaxed. 5. Slowly return to lying flat on the floor. Bridges Repeat these steps 10 times: 1. Lie on your back on a firm surface. 2. Bend your knees so they are pointing toward the ceiling and your feet are flat on the floor. 3. Tighten your buttocks muscles and lift your buttocks off of the floor until your waist is at almost the same height as your knees. You should feel the muscles working in your buttocks and the back of your thighs. If you do not feel these muscles, slide your feet 1-2 inches farther away from your buttocks. 4. Hold this position for 3-5 seconds. 5. Slowly lower your hips to the starting position, and allow your buttocks muscles to relax completely. If this exercise is too easy, try doing it with your arms crossed over your chest. Abdominal Crunches Repeat these steps 5-10 times: 1. Lie on your back on a firm bed or the floor with your legs extended. 2. Bend your knees so they are pointing toward the ceiling and your feet are flat on the floor. 3. Cross your arms over your chest. 4. Tip your chin slightly toward your chest without bending your neck. 5. Tighten your abdominal muscles and slowly raise your trunk (torso) high enough to lift your shoulder blades a tiny bit off of the floor. Avoid raising your torso higher than that, because it can put too much stress on your low back and it does not help to strengthen your abdominal muscles. 6. Slowly return to your starting position. Back Lifts Repeat these steps 5-10 times: 1. Lie on your abdomen (face-down) with your arms at your sides, and rest your forehead on the floor. 2. Tighten the muscles in your legs and your buttocks. 3. Slowly lift your chest off of the floor while you keep your hips pressed to the floor. Keep the back of your head in line with the curve in your back. Your eyes should be looking at the floor. 4. Hold this position for 3-5 seconds. 5. Slowly return to your  starting position. SEEK MEDICAL CARE IF:  Your back pain or discomfort gets much worse when you do an exercise.  Your back pain or discomfort does not lessen within 2 hours after you exercise. If you have any of these problems, stop doing these exercises right away. Do not do them again unless your health care provider says that you can. SEEK IMMEDIATE MEDICAL CARE IF:  You develop sudden, severe back pain. If this happens, stop doing the exercises right away. Do not do them again unless your health care provider says that you can.   This information is not intended to replace advice given to you by your health care provider. Make sure you discuss any questions you have with your health care provider.   Document Released: 05/18/2004 Document Revised: 12/30/2014 Document Reviewed: 06/04/2014 Elsevier Interactive Patient Education [employer].

## 2015-07-01 NOTE — ED Notes (Signed)
Pt in via triage with worsening left lower back pain over the last week.  Pt reports bending over about a week ago just doing normal daily activities and hearing a "pop."  Pt A/Ox4, no immediate distress at this time.

## 2015-11-25 ENCOUNTER — Encounter: Payer: Self-pay | Admitting: Emergency Medicine

## 2015-11-25 ENCOUNTER — Emergency Department
Admission: EM | Admit: 2015-11-25 | Discharge: 2015-11-25 | Disposition: A | Discharge status: Home / Self Care | Payer: Self-pay | Attending: Emergency Medicine | Admitting: Emergency Medicine

## 2015-11-25 DIAGNOSIS — Z8541 Personal history of malignant neoplasm of cervix uteri: Secondary | ICD-10-CM | POA: Insufficient documentation

## 2015-11-25 DIAGNOSIS — F17200 Nicotine dependence, unspecified, uncomplicated: Secondary | ICD-10-CM | POA: Insufficient documentation

## 2015-11-25 DIAGNOSIS — K602 Anal fissure, unspecified: Secondary | ICD-10-CM | POA: Insufficient documentation

## 2015-11-25 DIAGNOSIS — K6289 Other specified diseases of anus and rectum: Secondary | ICD-10-CM

## 2015-11-25 LAB — CBC
HEMATOCRIT: 39.8 % (ref 35.0–47.0)
HEMOGLOBIN: 13.7 g/dL (ref 12.0–16.0)
MCH: 31.2 pg (ref 26.0–34.0)
MCHC: 34.4 g/dL (ref 32.0–36.0)
MCV: 90.6 fL (ref 80.0–100.0)
Platelets: 277 10*3/uL (ref 150–440)
RBC: 4.39 MIL/uL (ref 3.80–5.20)
RDW: 14.2 % (ref 11.5–14.5)
WBC: 9.8 10*3/uL (ref 3.6–11.0)

## 2015-11-25 LAB — COMPREHENSIVE METABOLIC PANEL
ALBUMIN: 4.3 g/dL (ref 3.5–5.0)
ALT: 11 U/L — ABNORMAL LOW (ref 14–54)
ANION GAP: 8 (ref 5–15)
AST: 12 U/L — ABNORMAL LOW (ref 15–41)
Alkaline Phosphatase: 52 U/L (ref 38–126)
BILIRUBIN TOTAL: 0.2 mg/dL — AB (ref 0.3–1.2)
BUN: 12 mg/dL (ref 6–20)
CHLORIDE: 106 mmol/L (ref 101–111)
CO2: 22 mmol/L (ref 22–32)
Calcium: 8.9 mg/dL (ref 8.9–10.3)
Creatinine, Ser: 0.98 mg/dL (ref 0.44–1.00)
GFR calc Af Amer: >60 mL/min (ref >60)
GFR calc non Af Amer: >60 mL/min (ref >60)
GLUCOSE: 133 mg/dL — AB (ref 65–99)
POTASSIUM: 3.4 mmol/L — AB (ref 3.5–5.1)
SODIUM: 136 mmol/L (ref 135–145)
TOTAL PROTEIN: 7.4 g/dL (ref 6.5–8.1)

## 2015-11-25 LAB — LIPASE, BLOOD: LIPASE: 28 U/L (ref 11–51)

## 2015-11-25 MED ORDER — LIDOCAINE HCL 2 % EX GEL
1.0000 "application " | Freq: Once | CUTANEOUS | Status: AC
  Administered 2015-11-25: 1 via TOPICAL

## 2015-11-25 MED ORDER — OXYCODONE-ACETAMINOPHEN 5-325 MG PO TABS
2.0000 | ORAL_TABLET | Freq: Once | ORAL | Status: AC
  Administered 2015-11-25: 2 via ORAL

## 2015-11-25 MED ORDER — LIDOCAINE HCL 2 % EX GEL
CUTANEOUS | Status: AC
  Administered 2015-11-25: 1 via TOPICAL
  Filled 2015-11-25: qty 10

## 2015-11-25 MED ORDER — OXYCODONE-ACETAMINOPHEN 5-325 MG PO TABS: 2.0000 | ORAL_TABLET | Freq: Four times a day (QID) | ORAL | 0 refills | Status: AC | PRN

## 2015-11-25 MED ORDER — LIDOCAINE VISCOUS 2 % MT SOLN: 20.0000 mL | OROMUCOSAL | 0 refills | Status: DC | PRN

## 2015-11-25 MED ORDER — OXYCODONE-ACETAMINOPHEN 5-325 MG PO TABS
ORAL_TABLET | ORAL | Status: AC
  Administered 2015-11-25: 2 via ORAL
  Filled 2015-11-25: qty 2

## 2015-11-25 NOTE — ED Triage Notes (Signed)
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  C/O rectal pain, similar to when patient has a fissure, onset of pain one week ago with pain worsening.

## 2015-11-25 NOTE — ED Provider Notes (Signed)
[facility] Emergency Department Provider Note        Time seen: ----------------------------------------- 3:49 PM on 11/25/2015 -----------------------------------------    I have reviewed the triage vital signs and the nursing notes.   HISTORY  Chief Complaint Rectal Pain    HPI Stephanie Franklin is a 45 y.o. female who presents to ER for rectal pain. Patient has a history of anal fissure and it was corrected by surgery in 2009. She continues to complain of rectal pain for over week similar to when she had an anal fissure. She states she was not constipated prior to this. She states the pain is worsening, she denies fevers. She has had some rectal bleeding.   Past Medical History:  Diagnosis Date  . Cervical ca ([place]) 1990  . UTI (urinary tract infection)     There are no active problems to display for this patient.   Past Surgical History:  Procedure Laterality Date  . ANAL FISSURE REPAIR    . CESAREAN SECTION      Allergies Review of patient's allergies indicates no known allergies.  Social History Social History  Substance Use Topics  . Smoking status: Current Every Day Smoker  . Smokeless tobacco: Never Used  . Alcohol use No    Review of Systems Constitutional: Negative for fever. Cardiovascular: Negative for chest pain. Respiratory: Negative for shortness of breath. Gastrointestinal: Negative for abdominal pain, vomiting and diarrhea.Positive for rectal bleeding, rectal pain Genitourinary: Negative for dysuria. Musculoskeletal: Negative for back pain. Skin: Negative for rash. Neurological: Negative for headaches, focal weakness or numbness.  10-point ROS otherwise negative.  ____________________________________________   PHYSICAL EXAM:  VITAL SIGNS: ED Triage Vitals  Enc Vitals Group     BP 11/25/15 1443 (!) 148/98     Pulse Rate 11/25/15 1443 (!) 102     Resp 11/25/15 1443 16     Temp 11/25/15 1443 98.5 F  (36.9 C)     Temp Source 11/25/15 1443 Oral     SpO2 11/25/15 1443 99 %     Weight 11/25/15 1443 175 lb (79.4 kg)     Height 11/25/15 1443 5' (1.524 m)     Head Circumference --      Peak Flow --      Pain Score 11/25/15 1445 10     Pain Loc --      Pain Edu? --      Excl. in [place]? --     Constitutional: Alert and oriented. Well appearing and in no distress. Eyes: Conjunctivae are normal. Normal extraocular movements. Gastrointestinal: Soft and nontender. Normal bowel sounds Rectal: Patient is rectal tenderness, no palpable hemorrhoid or obvious anal fissure Musculoskeletal: Nontender with normal range of motion in all extremities. No lower extremity tenderness nor edema. Neurologic:  Normal speech and language. No gross focal neurologic deficits are appreciated.  Skin:  Skin is warm, dry and intact. No rash noted. Psychiatric: Mood and affect are normal. Speech and behavior are normal.  ____________________________________________  ED COURSE:  Pertinent labs & imaging results that were available during my care of the patient were reviewed by me and considered in my medical decision making (see chart for details). Clinical Course  Patient is no acute distress, will check basic labs and review previous surgical notes.  Procedures ____________________________________________   LABS (pertinent positives/negatives)  Labs Reviewed  COMPREHENSIVE METABOLIC PANEL - Abnormal; Notable for the following:       Result Value   Potassium 3.4 (*)  Glucose, Bld 133 (*)    AST 12 (*)    ALT 11 (*)    Total Bilirubin 0.2 (*)    All other components within normal limits  LIPASE, BLOOD  CBC  URINALYSIS COMPLETEWITH MICROSCOPIC ([facility] ONLY)   ____________________________________________  FINAL ASSESSMENT AND PLAN  Rectal pain, Possible anal fissure  Plan: Patient with labs as dictated above. Patient is in no acute distress, she has been given viscous lidocaine, I will prescribe  pain medicine as she states these do not constipate her. I will also prescribe a topical nitroglycerin. She is stable for outpatient follow-up with Gastroenterology    Earleen Newport, MD   Note: This dictation was prepared with Dragon dictation. Any transcriptional errors that result from this process are unintentional    Earleen Newport, MD 11/25/15 [phone]

## 2015-12-12 ENCOUNTER — Encounter: Payer: Self-pay | Admitting: Emergency Medicine

## 2015-12-12 ENCOUNTER — Emergency Department
Admission: EM | Admit: 2015-12-12 | Discharge: 2015-12-12 | Disposition: A | Discharge status: Home / Self Care | Payer: Self-pay | Attending: Emergency Medicine | Admitting: Emergency Medicine

## 2015-12-12 DIAGNOSIS — F17210 Nicotine dependence, cigarettes, uncomplicated: Secondary | ICD-10-CM | POA: Insufficient documentation

## 2015-12-12 DIAGNOSIS — K6289 Other specified diseases of anus and rectum: Secondary | ICD-10-CM | POA: Insufficient documentation

## 2015-12-12 LAB — CBC WITH DIFFERENTIAL/PLATELET
BASOS ABS: 0.1 10*3/uL (ref 0–0.1)
BASOS PCT: 1 %
EOS ABS: 0.1 10*3/uL (ref 0–0.7)
EOS PCT: 1 %
HCT: 38.5 % (ref 35.0–47.0)
Hemoglobin: 13.5 g/dL (ref 12.0–16.0)
Lymphocytes Relative: 21 %
Lymphs Abs: 2.2 10*3/uL (ref 1.0–3.6)
MCH: 31.7 pg (ref 26.0–34.0)
MCHC: 35 g/dL (ref 32.0–36.0)
MCV: 90.5 fL (ref 80.0–100.0)
MONO ABS: 0.7 10*3/uL (ref 0.2–0.9)
Monocytes Relative: 7 %
Neutro Abs: 7.4 10*3/uL — ABNORMAL HIGH (ref 1.4–6.5)
Neutrophils Relative %: 70 %
PLATELETS: 264 10*3/uL (ref 150–440)
RBC: 4.25 MIL/uL (ref 3.80–5.20)
RDW: 14.6 % — AB (ref 11.5–14.5)
WBC: 10.5 10*3/uL (ref 3.6–11.0)

## 2015-12-12 MED ORDER — LIDOCAINE HCL 2 % EX GEL: 1.0000 "application " | Freq: Two times a day (BID) | CUTANEOUS | 0 refills | Status: AC

## 2015-12-12 MED ORDER — LIDOCAINE VISCOUS 2 % MT SOLN: 20.0000 mL | OROMUCOSAL | 0 refills | Status: AC | PRN

## 2015-12-12 MED ORDER — NITROGLYCERIN 0.4 % RE OINT: 1.0000 [in_us] | TOPICAL_OINTMENT | Freq: Two times a day (BID) | RECTAL | 0 refills | Status: AC

## 2015-12-12 NOTE — ED Provider Notes (Signed)
[facility] Emergency Department Provider Note    ____________________________________________   I have reviewed the triage vital signs and the nursing notes.   HISTORY  Chief Complaint Anal Fissure   History limited by: Not Limited   HPI Stephanie Franklin is a 45 y.o. female who presents to the emergency department tonight because of concern for continued pain from anal fissure. The patient states that she ran out of the pain pills and cream that were prescribed to her at her last emergency department visit. The patient states that the pain is severe. Located at her anus. She has noticed associated bleeding. She denies any fevers.    Past Medical History:  Diagnosis Date  . Cervical ca ([place]) 1990  . UTI (urinary tract infection)     There are no active problems to display for this patient.   Past Surgical History:  Procedure Laterality Date  . ANAL FISSURE REPAIR    . CESAREAN SECTION      Prior to Admission medications   Medication Sig Start Date End Date Taking? Authorizing Provider  cephALEXin (KEFLEX) 500 MG capsule Take 1 capsule (500 mg total) by mouth 2 (two) times daily. 12/22/14   Jenise V Bacon Menshew, PA-C  diazepam (VALIUM) 5 MG tablet Take 1 tablet (5 mg total) by mouth every 8 (eight) hours as needed for muscle spasms. 07/01/15   Carrie Mew, MD  ibuprofen (ADVIL,MOTRIN) 800 MG tablet Take 1 tablet (800 mg total) by mouth every 8 (eight) hours as needed for moderate pain (with food). 01/30/15   Anne-Caroline Mariea Clonts, MD  lidocaine (XYLOCAINE) 2 % solution Use as directed 20 mLs in the mouth or throat as needed for mouth pain. 11/25/15   Earleen Newport, MD  naproxen (NAPROSYN) 500 MG tablet Take 1 tablet (500 mg total) by mouth 2 (two) times daily with a meal. 07/01/15   Carrie Mew, MD  oxyCODONE-acetaminophen (PERCOCET) 5-325 MG tablet Take 2 tablets by mouth every 6 (six) hours as needed for moderate pain or severe pain.  11/25/15   Earleen Newport, MD  oxyCODONE-acetaminophen (ROXICET) 5-325 MG tablet Take 1 tablet by mouth every 6 (six) hours as needed. 01/30/15 01/30/16  Eula Listen, MD    Allergies Review of patient's allergies indicates no known allergies.  Family History  Problem Relation Age of Onset  . Breast cancer Mother     92's    Social History Social History  Substance Use Topics  . Smoking status: Current Every Day Smoker    Packs/day: 0.50    Types: Cigarettes  . Smokeless tobacco: Never Used  . Alcohol use No    Review of Systems  Constitutional: Negative for fever. Cardiovascular: Negative for chest pain. Respiratory: Negative for shortness of breath. Gastrointestinal: Negative for abdominal pain, vomiting and diarrhea. Genitourinary: Negative for dysuria. Musculoskeletal: Negative for back pain. Skin: Negative for rash. Neurological: Negative for headaches, focal weakness or numbness.  10-point ROS otherwise negative.  ____________________________________________   PHYSICAL EXAM:  VITAL SIGNS: ED Triage Vitals [12/12/15 1553]  Enc Vitals Group     BP (!) 154/93     Pulse Rate 88     Resp 16     Temp 98.2 F (36.8 C)     Temp Source Oral     SpO2 99 %     Weight 171 lb (77.6 kg)     Height 5' (1.524 m)     Head Circumference      Peak Flow  Pain Score 10   Constitutional: Alert and oriented. Well appearing and in no distress. Eyes: Conjunctivae are normal. PERRL. Normal extraocular movements. ENT   Head: Normocephalic and atraumatic.   Nose: No congestion/rhinnorhea.   Mouth/Throat: Mucous membranes are moist.   Neck: No stridor. Hematological/Lymphatic/Immunilogical: No cervical lymphadenopathy. Cardiovascular: Normal rate, regular rhythm.  No murmurs, rubs, or gallops. Respiratory: Normal respiratory effort without tachypnea nor retractions. Breath sounds are clear and equal bilaterally. No  wheezes/rales/rhonchi. Gastrointestinal: Soft and nontender. No distention. Rectal: Patient declined rectal examination. Musculoskeletal: Normal range of motion in all extremities. No joint effusions.  No lower extremity tenderness nor edema. Neurologic:  Normal speech and language. No gross focal neurologic deficits are appreciated.  Skin:  Skin is warm, dry and intact. No rash noted. Psychiatric: Mood and affect are normal. Speech and behavior are normal. Patient exhibits appropriate insight and judgment.  ____________________________________________    LABS (pertinent positives/negatives)  Labs Reviewed  CBC WITH DIFFERENTIAL/PLATELET - Abnormal; Notable for the following:       Result Value   RDW 14.6 (*)    Neutro Abs 7.4 (*)    All other components within normal limits     ____________________________________________   EKG  None  ____________________________________________    RADIOLOGY  None  ____________________________________________   PROCEDURES  Procedures  ____________________________________________   INITIAL IMPRESSION / ASSESSMENT AND PLAN / ED COURSE  Pertinent labs & imaging results that were available during my care of the patient were reviewed by me and considered in my medical decision making (see chart for details).  Patient presents with continued pain from anal fissure. Ran out of pain medications. States she has an appointment scheduled next month. Denies any systemic signs of illness. Declined rectal exam. CBC without concerning leukocytosis. Will give patient prescription for lidocaine and nitroglycerin.   ____________________________________________   FINAL CLINICAL IMPRESSION(S) / ED DIAGNOSES  Final diagnoses:  Anal or rectal pain     Note: This dictation was prepared with Dragon dictation. Any transcriptional errors that result from this process are unintentional    Nance Pear, MD 12/12/15 1704

## 2015-12-12 NOTE — Discharge Instructions (Signed)
Please seek medical attention for any high fevers, chest pain, shortness of breath, change in behavior, persistent vomiting, bloody stool or any other new or concerning symptoms.  
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
## 2015-12-12 NOTE — ED Notes (Addendum)
Was seen 11/25/15 and given rx for percocet and topical lidocaine but ran out of medication. Also states noted a change in her bowels, that they are more tarry

## 2015-12-12 NOTE — ED Triage Notes (Signed)
Had anal fissure repair in 2009 or 2010. Has another one and is to see [place] 12/29/15 but states can't take the pain until she is seen. Ran out of percocet prescribed by pcp

## 2016-01-03 ENCOUNTER — Ambulatory Visit: Payer: Self-pay | Attending: Oncology | Admitting: *Deleted

## 2016-01-03 ENCOUNTER — Ambulatory Visit
Admission: RE | Admit: 2016-01-03 | Discharge: 2016-01-03 | Disposition: A | Discharge status: Home / Self Care | Payer: Self-pay | Source: Ambulatory Visit | Attending: Oncology | Admitting: Oncology

## 2016-01-03 ENCOUNTER — Encounter (INDEPENDENT_AMBULATORY_CARE_PROVIDER_SITE_OTHER): Payer: Self-pay

## 2016-01-03 ENCOUNTER — Encounter: Payer: Self-pay | Admitting: *Deleted

## 2016-01-03 VITALS — BP 153/90 | HR 67 | Temp 98.1°F | Resp 24 | Ht 61.02 in | Wt 158.7 lb

## 2016-01-03 DIAGNOSIS — N63 Unspecified lump in unspecified breast: Secondary | ICD-10-CM

## 2016-01-03 NOTE — Patient Instructions (Signed)
Gave patient hand-out, Women Staying Healthy, Active and Well from [facility], with education on breast health, pap smears, heart and colon health. 

## 2016-01-03 NOTE — Progress Notes (Signed)
Subjective:     Patient ID: Stephanie Franklin, female   DOB: [birth year], 45 y.o.   MRN: [id number]  HPI   Review of Systems     Objective:   Physical Exam  Pulmonary/Chest: Right breast exhibits no inverted nipple, no mass, no nipple discharge, no skin change and no tenderness. Left breast exhibits no inverted nipple, no mass, no nipple discharge, no skin change and no tenderness. Breasts are symmetrical.         Assessment:     45 year old White female returns to [facility] for annual screening.  Last mammo was a birads 3 for a left breast mass.  Clinical breast exam without dominant mass, skin changes, nipple discharge or lymphadenopathy.  Taught self breast awareness.  Blood pressure elevated at 153/90.  States it has been high and she has an appointment next week with her PCP.  Last pap on 12/10/13 was negative / negative.  Next pap will be due in 2020.  Patient has been screened for eligibility.  She does not have any insurance, Medicare or Medicaid.  She also meets financial eligibility.  Hand-out given on the Affordable Care Act.    Plan:     Bilateral diagnostic mammogram with ultrasound ordered.  Will follow-up per protocol.

## 2016-01-04 ENCOUNTER — Encounter: Payer: Self-pay | Admitting: *Deleted

## 2016-01-04 NOTE — Progress Notes (Signed)
Letter mailed from the [facility] to inform patient of her normal mammogram results.  Patient is to follow-up with annual screening in one year.  HSIS to [place]. 

## 2017-07-06 ENCOUNTER — Encounter (HOSPITAL_COMMUNITY): Payer: Self-pay | Admitting: Radiology

## 2017-07-06 ENCOUNTER — Emergency Department (HOSPITAL_COMMUNITY): Payer: Self-pay

## 2017-07-06 ENCOUNTER — Emergency Department (HOSPITAL_COMMUNITY)
Admission: EM | Admit: 2017-07-06 | Discharge: 2017-07-06 | Disposition: A | Discharge status: Home / Self Care | Payer: Self-pay | Attending: Emergency Medicine | Admitting: Emergency Medicine

## 2017-07-06 DIAGNOSIS — S0003XA Contusion of scalp, initial encounter: Secondary | ICD-10-CM | POA: Insufficient documentation

## 2017-07-06 DIAGNOSIS — I10 Essential (primary) hypertension: Secondary | ICD-10-CM | POA: Insufficient documentation

## 2017-07-06 DIAGNOSIS — F17210 Nicotine dependence, cigarettes, uncomplicated: Secondary | ICD-10-CM | POA: Insufficient documentation

## 2017-07-06 DIAGNOSIS — Y9389 Activity, other specified: Secondary | ICD-10-CM | POA: Insufficient documentation

## 2017-07-06 DIAGNOSIS — Z23 Encounter for immunization: Secondary | ICD-10-CM | POA: Insufficient documentation

## 2017-07-06 DIAGNOSIS — S62643A Nondisplaced fracture of proximal phalanx of left middle finger, initial encounter for closed fracture: Secondary | ICD-10-CM | POA: Insufficient documentation

## 2017-07-06 DIAGNOSIS — E876 Hypokalemia: Secondary | ICD-10-CM | POA: Insufficient documentation

## 2017-07-06 DIAGNOSIS — S060X1A Concussion with loss of consciousness of 30 minutes or less, initial encounter: Secondary | ICD-10-CM | POA: Insufficient documentation

## 2017-07-06 DIAGNOSIS — Z79899 Other long term (current) drug therapy: Secondary | ICD-10-CM | POA: Insufficient documentation

## 2017-07-06 DIAGNOSIS — Y998 Other external cause status: Secondary | ICD-10-CM | POA: Insufficient documentation

## 2017-07-06 DIAGNOSIS — Y92410 Unspecified street and highway as the place of occurrence of the external cause: Secondary | ICD-10-CM | POA: Insufficient documentation

## 2017-07-06 HISTORY — DX: Essential (primary) hypertension: I10

## 2017-07-06 LAB — URINALYSIS, ROUTINE W REFLEX MICROSCOPIC
Bacteria, UA: NONE SEEN
Bilirubin Urine: NEGATIVE
GLUCOSE, UA: NEGATIVE mg/dL
Ketones, ur: NEGATIVE mg/dL
Leukocytes, UA: NEGATIVE
Nitrite: NEGATIVE
Protein, ur: NEGATIVE mg/dL
SPECIFIC GRAVITY, URINE: 1.017 (ref 1.005–1.030)
Squamous Epithelial / LPF: NONE SEEN
pH: 6 (ref 5.0–8.0)

## 2017-07-06 LAB — RAPID URINE DRUG SCREEN, HOSP PERFORMED
Amphetamines: NOT DETECTED
BENZODIAZEPINES: NOT DETECTED
Barbiturates: NOT DETECTED
Cocaine: NOT DETECTED
Opiates: POSITIVE — AB
Tetrahydrocannabinol: NOT DETECTED

## 2017-07-06 LAB — CBC
HCT: 36.2 % (ref 36.0–46.0)
Hemoglobin: 12.3 g/dL (ref 12.0–15.0)
MCH: 30.8 pg (ref 26.0–34.0)
MCHC: 34 g/dL (ref 30.0–36.0)
MCV: 90.7 fL (ref 78.0–100.0)
Platelets: 304 10*3/uL (ref 150–400)
RBC: 3.99 MIL/uL (ref 3.87–5.11)
RDW: 13.1 % (ref 11.5–15.5)
WBC: 10.2 10*3/uL (ref 4.0–10.5)

## 2017-07-06 LAB — PROTIME-INR
INR: 0.91
Prothrombin Time: 12.1 seconds (ref 11.4–15.2)

## 2017-07-06 LAB — CDS SEROLOGY

## 2017-07-06 LAB — I-STAT CHEM 8, ED
BUN: 13 mg/dL (ref 6–20)
CALCIUM ION: 1.04 mmol/L — AB (ref 1.15–1.40)
CHLORIDE: 99 mmol/L — AB (ref 101–111)
Creatinine, Ser: 0.9 mg/dL (ref 0.44–1.00)
Glucose, Bld: 112 mg/dL — ABNORMAL HIGH (ref 65–99)
HEMATOCRIT: 37 % (ref 36.0–46.0)
Hemoglobin: 12.6 g/dL (ref 12.0–15.0)
Potassium: 2.4 mmol/L — CL (ref 3.5–5.1)
Sodium: 137 mmol/L (ref 135–145)
TCO2: 24 mmol/L (ref 22–32)

## 2017-07-06 LAB — SAMPLE TO BLOOD BANK

## 2017-07-06 LAB — COMPREHENSIVE METABOLIC PANEL
ALBUMIN: 4.2 g/dL (ref 3.5–5.0)
ALK PHOS: 47 U/L (ref 38–126)
ALT: 14 U/L (ref 14–54)
AST: 14 U/L — AB (ref 15–41)
Anion gap: 13 (ref 5–15)
BILIRUBIN TOTAL: 0.4 mg/dL (ref 0.3–1.2)
BUN: 12 mg/dL (ref 6–20)
CALCIUM: 8.9 mg/dL (ref 8.9–10.3)
CO2: 24 mmol/L (ref 22–32)
CREATININE: 0.96 mg/dL (ref 0.44–1.00)
Chloride: 100 mmol/L — ABNORMAL LOW (ref 101–111)
GFR calc Af Amer: >60 mL/min (ref >60)
GFR calc non Af Amer: >60 mL/min (ref >60)
GLUCOSE: 113 mg/dL — AB (ref 65–99)
Potassium: 2.4 mmol/L — CL (ref 3.5–5.1)
Sodium: 137 mmol/L (ref 135–145)
TOTAL PROTEIN: 7 g/dL (ref 6.5–8.1)

## 2017-07-06 LAB — ETHANOL

## 2017-07-06 LAB — I-STAT BETA HCG BLOOD, ED (MC, WL, AP ONLY)

## 2017-07-06 LAB — I-STAT CG4 LACTIC ACID, ED: LACTIC ACID, VENOUS: 1.24 mmol/L (ref 0.5–1.9)

## 2017-07-06 MED ORDER — SODIUM CHLORIDE 0.9 % IV SOLN
Freq: Once | INTRAVENOUS | Status: AC
  Administered 2017-07-06: 19:00:00 via INTRAVENOUS

## 2017-07-06 MED ORDER — IBUPROFEN 600 MG PO TABS: 600.0000 mg | ORAL_TABLET | Freq: Four times a day (QID) | ORAL | 0 refills | Status: AC | PRN

## 2017-07-06 MED ORDER — POTASSIUM CHLORIDE 10 MEQ/100ML IV SOLN
10.0000 meq | Freq: Once | INTRAVENOUS | Status: AC
  Administered 2017-07-06: 10 meq via INTRAVENOUS
  Filled 2017-07-06: qty 100

## 2017-07-06 MED ORDER — FENTANYL CITRATE (PF) 100 MCG/2ML IJ SOLN
50.0000 ug | Freq: Once | INTRAMUSCULAR | Status: AC
  Administered 2017-07-06: 50 ug via INTRAVENOUS
  Filled 2017-07-06: qty 2

## 2017-07-06 MED ORDER — MORPHINE SULFATE (PF) 4 MG/ML IV SOLN
4.0000 mg | Freq: Once | INTRAVENOUS | Status: AC
  Administered 2017-07-06: 4 mg via INTRAVENOUS
  Filled 2017-07-06: qty 1

## 2017-07-06 MED ORDER — POTASSIUM CHLORIDE CRYS ER 20 MEQ PO TBCR: 20.0000 meq | EXTENDED_RELEASE_TABLET | Freq: Two times a day (BID) | ORAL | 0 refills | Status: AC

## 2017-07-06 MED ORDER — POTASSIUM CHLORIDE CRYS ER 20 MEQ PO TBCR
40.0000 meq | EXTENDED_RELEASE_TABLET | Freq: Once | ORAL | Status: AC
  Administered 2017-07-06: 40 meq via ORAL
  Filled 2017-07-06: qty 2

## 2017-07-06 MED ORDER — IOPAMIDOL (ISOVUE-300) INJECTION 61%
INTRAVENOUS | Status: AC
  Administered 2017-07-06: 100 mL
  Filled 2017-07-06: qty 100

## 2017-07-06 MED ORDER — TETANUS-DIPHTH-ACELL PERTUSSIS 5-2.5-18.5 LF-MCG/0.5 IM SUSP
0.5000 mL | Freq: Once | INTRAMUSCULAR | Status: AC
  Administered 2017-07-06: 0.5 mL via INTRAMUSCULAR
  Filled 2017-07-06: qty 0.5

## 2017-07-06 MED ORDER — CYCLOBENZAPRINE HCL 10 MG PO TABS: 10.0000 mg | ORAL_TABLET | Freq: Two times a day (BID) | ORAL | 0 refills | Status: AC | PRN
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
## 2017-07-06 NOTE — ED Notes (Signed)
Resident Nyoka Lint and Dr. Leonette Monarch notified of pt's pain 10/10.  Orders received.

## 2017-07-06 NOTE — Progress Notes (Signed)
Orthopedic Tech Progress Note Patient Details:  Stephanie Franklin 01/09/1971 [id number]  Ortho Devices Type of Ortho Device: Soft collar, Buddy tape Ortho Device/Splint Location: soft collar applied to pt neck and buddy tape applied to pt left middle finger.  Ortho Device/Splint Interventions: Application   Post Interventions Patient Tolerated: Well Instructions Provided: Care of device, Adjustment of device   Kristopher Oppenheim 07/06/2017, 9:38 PM

## 2017-07-06 NOTE — ED Notes (Signed)
Pt given paper scrubs and socks for discharge.  All belongings including phone with pt at discharge.
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
## 2017-07-06 NOTE — ED Notes (Signed)
Dr. Nyoka Lint at bedside to update pt on plan of care for discharge.  C-collar removed.
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
## 2017-07-06 NOTE — ED Notes (Signed)
Ortho paged for soft collar

## 2017-07-06 NOTE — ED Notes (Signed)
Pt had [place] External female catheter on and urinated.  Linens slightly wet under her.  Pt log rolled and linens changed.

## 2017-07-06 NOTE — ED Notes (Signed)
Report from [place], [place].  Pt in CT.
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
## 2017-07-06 NOTE — ED Notes (Signed)
Pt taken to CT.

## 2017-08-08 ENCOUNTER — Ambulatory Visit: Payer: Self-pay | Attending: Oncology | Admitting: *Deleted

## 2017-08-08 ENCOUNTER — Ambulatory Visit
Admission: RE | Admit: 2017-08-08 | Discharge: 2017-08-08 | Disposition: A | Discharge status: Home / Self Care | Payer: Self-pay | Source: Ambulatory Visit | Attending: Oncology | Admitting: Oncology

## 2017-08-08 ENCOUNTER — Encounter: Payer: Self-pay | Admitting: *Deleted

## 2017-08-08 VITALS — BP 132/91 | HR 91 | Temp 97.7°F | Resp 18 | Ht 60.0 in | Wt 166.0 lb

## 2017-08-08 DIAGNOSIS — Z0000 Encounter for general adult medical examination without abnormal findings: Secondary | ICD-10-CM | POA: Insufficient documentation

## 2017-08-08 NOTE — Patient Instructions (Signed)
Gave patient hand-out, Women Staying Healthy, Active and Well from [facility], with education on breast health, pap smears, heart and colon health. 

## 2017-08-08 NOTE — Progress Notes (Addendum)
Subjective:     Patient ID: Stephanie Franklin, female   DOB: [birth year], 47 y.o.   MRN: [id number]  HPI   Review of Systems     Objective:   Physical Exam  Pulmonary/Chest: Right breast exhibits no inverted nipple, no mass, no nipple discharge, no skin change and no tenderness. Left breast exhibits no inverted nipple, no mass, no nipple discharge, no skin change and no tenderness. Breasts are symmetrical.       Assessment:     47 year old White female returns to [facility] for annual screening.  Clinical breast exam unremarkable.  Taught self breast awareness.  Patient with family history of breast cancer in her mom at age 42, maternal great grandmother, and a paternal aunt.  According to the [place] patient has a 2.5% risk of getting breast cancer in the next 5 years. Encouraged annual screening.  Patient states personal history of cervical cancer at age 16.  Last pap on 8/15 was negative / negative.  Next pap due in 2020.  Patient has been screened for eligibility.  She does not have any insurance, Medicare or Medicaid.  She also meets financial eligibility.  Hand-out given on the Affordable Care Act.    Plan:     Screening mammogram ordered.  Will follow-up per [facility]CP protocol.

## 2017-08-08 NOTE — Progress Notes (Signed)
Letter mailed from the [facility] to inform patient of her normal mammogram results.  Patient is to follow-up with annual screening in one year.  HSIS to [place]. 

## 2017-09-19 ENCOUNTER — Emergency Department
Admission: EM | Admit: 2017-09-19 | Discharge: 2017-09-19 | Disposition: A | Discharge status: Home / Self Care | Payer: Self-pay | Attending: Emergency Medicine | Admitting: Emergency Medicine

## 2017-09-19 ENCOUNTER — Other Ambulatory Visit: Payer: Self-pay

## 2017-09-19 DIAGNOSIS — I10 Essential (primary) hypertension: Secondary | ICD-10-CM | POA: Insufficient documentation

## 2017-09-19 DIAGNOSIS — Y9301 Activity, walking, marching and hiking: Secondary | ICD-10-CM | POA: Insufficient documentation

## 2017-09-19 DIAGNOSIS — S51852A Open bite of left forearm, initial encounter: Secondary | ICD-10-CM | POA: Insufficient documentation

## 2017-09-19 DIAGNOSIS — F17210 Nicotine dependence, cigarettes, uncomplicated: Secondary | ICD-10-CM | POA: Insufficient documentation

## 2017-09-19 DIAGNOSIS — Y929 Unspecified place or not applicable: Secondary | ICD-10-CM | POA: Insufficient documentation

## 2017-09-19 DIAGNOSIS — Y999 Unspecified external cause status: Secondary | ICD-10-CM | POA: Insufficient documentation

## 2017-09-19 DIAGNOSIS — W540XXA Bitten by dog, initial encounter: Secondary | ICD-10-CM | POA: Insufficient documentation

## 2017-09-19 MED ORDER — AMOXICILLIN-POT CLAVULANATE 875-125 MG PO TABS: 1.0000 | ORAL_TABLET | Freq: Two times a day (BID) | ORAL | 0 refills | Status: AC

## 2017-09-19 MED ORDER — AMOXICILLIN-POT CLAVULANATE 875-125 MG PO TABS
1.0000 | ORAL_TABLET | Freq: Once | ORAL | Status: AC
  Administered 2017-09-19: 1 via ORAL
  Filled 2017-09-19: qty 1

## 2017-09-19 MED ORDER — OXYCODONE-ACETAMINOPHEN 5-325 MG PO TABS
1.0000 | ORAL_TABLET | Freq: Once | ORAL | Status: AC
  Administered 2017-09-19: 1 via ORAL
  Filled 2017-09-19: qty 1

## 2017-09-19 MED ORDER — IBUPROFEN 800 MG PO TABS: 800.0000 mg | ORAL_TABLET | Freq: Three times a day (TID) | ORAL | 0 refills | Status: DC | PRN

## 2017-09-19 NOTE — ED Triage Notes (Signed)
Pt arrives to ED via POV from home following dog bite by neighbor's pit bull. Pt reports Animal Control/law enforcement has been notified. Pt was bitten on the LEFT elbow with 2 puncture marks and swelling noted. Unknown if dog is UTD on vaccinations.

## 2017-09-19 NOTE — ED Provider Notes (Signed)
[facility] Emergency Department Provider Note  ____________________________________________  Time seen: Approximately 7:54 PM  I have reviewed the triage vital signs and the nursing notes.   HISTORY  Chief Complaint Animal Bite    HPI Stephanie Franklin is a 47 y.o. female that presents to the emergency department for evaluation of animal bite. She was bitten by the neighbors pit bull on her way to work this morning. She was bitten on her left arm. She has had a lot of pain and swelling that is getting worse. Shes had some nausea but no vomiting. She was told that the dog is up to date on vaccinations but the owner could not find paperwork. Police have been notified. She has taken some tylenol a couple of hours ago, which has not helped.Last tetanus shot was in March. No fever.    Past Medical History:  Diagnosis Date  . Cervical ca ([place]) 1990  . Hypertension   . UTI (urinary tract infection)     There are no active problems to display for this patient.   Past Surgical History:  Procedure Laterality Date  . ANAL FISSURE REPAIR    . CESAREAN SECTION      Prior to Admission medications   Medication Sig Start Date End Date Taking? Authorizing Provider  amoxicillin-clavulanate (AUGMENTIN) 875-125 MG tablet Take 1 tablet by mouth 2 (two) times daily for 10 days. 09/19/17 09/29/17  Laban Emperor, PA-C  baclofen (LIORESAL) 10 MG tablet Take 10 mg by mouth 3 (three) times daily as needed for muscle spasms.    [provider]  cephALEXin (KEFLEX) 500 MG capsule Take 1 capsule (500 mg total) by mouth 2 (two) times daily. Patient not taking: Reported on 07/06/2017 12/22/14   Menshew, Dannielle Karvonen, PA-C  diazepam (VALIUM) 5 MG tablet Take 1 tablet (5 mg total) by mouth every 8 (eight) hours as needed for muscle spasms. Patient not taking: Reported on 07/06/2017 07/01/15   Carrie Mew, MD  hydrochlorothiazide (HYDRODIURIL) 25 MG tablet Take 25 mg  by mouth daily.    [provider]  ibuprofen (ADVIL,MOTRIN) 800 MG tablet Take 1 tablet (800 mg total) by mouth every 8 (eight) hours as needed. 09/19/17   Laban Emperor, PA-C  lidocaine (XYLOCAINE) 2 % jelly 1 application by Other route 2 (two) times daily. Rectally Patient not taking: Reported on 07/06/2017 12/12/15   Nance Pear, MD  lidocaine (XYLOCAINE) 2 % solution Use as directed 20 mLs in the mouth or throat as needed (Rectal). Patient not taking: Reported on 07/06/2017 12/12/15   Nance Pear, MD  Lidocaine, Anorectal, (RECTICARE) 5 % CREA Apply 1 application topically See admin instructions. Apply 1 application as directed daily to treat anal fissures    [provider]  LOSARTAN POTASSIUM PO Take 1 tablet by mouth daily.    [provider]  naproxen (NAPROSYN) 500 MG tablet Take 1 tablet (500 mg total) by mouth 2 (two) times daily with a meal. Patient not taking: Reported on 07/06/2017 07/01/15   Carrie Mew, MD  Nitroglycerin 0.4 % OINT Place 1 inch rectally 2 (two) times daily. Patient not taking: Reported on 07/06/2017 12/12/15   Nance Pear, MD  oxyCODONE-acetaminophen (PERCOCET) 5-325 MG tablet Take 2 tablets by mouth every 6 (six) hours as needed for moderate pain or severe pain. Patient not taking: Reported on 07/06/2017 11/25/15   Earleen Newport, MD  potassium chloride SA (K-DUR,KLOR-CON) 20 MEQ tablet Take 1 tablet (20 mEq total) by  mouth 2 (two) times daily for 3 days. 07/06/17 07/09/17  Tobie Poet, DO  traMADol (ULTRAM) 50 MG tablet Take 50 mg by mouth every 6 (six) hours as needed (for pain).    [provider]    Allergies Patient has no known allergies.  Family History  Problem Relation Age of Onset  . Breast cancer Mother        32's  . Breast cancer Paternal Aunt   . Breast cancer Other     Social History Social History   Tobacco Use  . Smoking status: Current Every Day Smoker    Packs/day: 0.50    Types:  Cigarettes  . Smokeless tobacco: Never Used  Substance Use Topics  . Alcohol use: No  . Drug use: No     Review of Systems  Constitutional: No fever/chills Cardiovascular: No chest pain. Respiratory: No SOB. Gastrointestinal: No abdominal pain.  Positive for nausea. No vomiting.  Musculoskeletal: Positive for elbow pain.  Skin: Negative for rash, ecchymosis.   ____________________________________________   PHYSICAL EXAM:  VITAL SIGNS: ED Triage Vitals  Enc Vitals Group     BP 09/19/17 1831 135/84     Pulse Rate 09/19/17 1831 92     Resp 09/19/17 1831 17     Temp 09/19/17 1831 98.9 F (37.2 C)     Temp Source 09/19/17 1831 Oral     SpO2 09/19/17 1831 99 %     Weight 09/19/17 1830 169 lb (76.7 kg)     Height 09/19/17 1830 5\' 1"  (1.549 m)     Head Circumference --      Peak Flow --      Pain Score 09/19/17 1830 7     Pain Loc --      Pain Edu? --      Excl. in [place]? --      Constitutional: Alert and oriented. Well appearing and in no acute distress. Eyes: Conjunctivae are normal. PERRL. EOMI. Head: Atraumatic. ENT:      Ears:      Nose: No congestion/rhinnorhea.      Mouth/Throat: Mucous membranes are moist.  Neck: No stridor. Cardiovascular: Normal rate, regular rhythm.  Good peripheral circulation. Respiratory: Normal respiratory effort without tachypnea or retractions. Lungs CTAB. Good air entry to the bases with no decreased or absent breath sounds. Musculoskeletal: Full range of motion to all extremities. No gross deformities appreciated.  Neurologic:  Normal speech and language. No gross focal neurologic deficits are appreciated.  Skin:  Skin is warm, dry. Two punctures to left arm with surrounding erythema and edema.   ____________________________________________   LABS (all labs ordered are listed, but only abnormal results are displayed)  Labs Reviewed - No data to  display ____________________________________________  EKG   ____________________________________________  RADIOLOGY   No results found.  ____________________________________________    PROCEDURES  Procedure(s) performed:    Procedures    Medications  oxyCODONE-acetaminophen (PERCOCET/ROXICET) 5-325 MG per tablet 1 tablet (1 tablet Oral Given 09/19/17 2055)  amoxicillin-clavulanate (AUGMENTIN) 875-125 MG per tablet 1 tablet (1 tablet Oral Given 09/19/17 2055)     ____________________________________________   INITIAL IMPRESSION / ASSESSMENT AND PLAN / ED COURSE  Pertinent labs & imaging results that were available during my care of the patient were reviewed by me and considered in my medical decision making (see chart for details).  Review of the [place] CSRS was performed in accordance of the [place] prior to dispensing any controlled drugs.   Patient's diagnosis is consistent  with dog bite. Vital signs and exam are reassuring. Tetanus is up to date. Patient will follow up with police and animal control for rabies status. Patient will be discharged home with prescriptions for Augmentin and ibuprofen. Patient is to follow up with PCP as directed. Patient is given ED precautions to return to the ED for any worsening or new symptoms.     ____________________________________________  FINAL CLINICAL IMPRESSION(S) / ED DIAGNOSES  Final diagnoses:  Dog bite, initial encounter      NEW MEDICATIONS STARTED DURING THIS VISIT:  ED Discharge Orders        Ordered    amoxicillin-clavulanate (AUGMENTIN) 875-125 MG tablet  2 times daily     09/19/17 2101    ibuprofen (ADVIL,MOTRIN) 800 MG tablet  Every 8 hours PRN     09/19/17 2101          This chart was dictated using voice recognition software/Dragon. Despite best efforts to proofread, errors can occur which can change the meaning. Any change was purely unintentional.    Laban Emperor, PA-C 09/20/17 0002     Hinda Kehr, MD 09/20/17 [phone]

## 2017-09-19 NOTE — Discharge Instructions (Addendum)
Follow up with police and animal control tomorrow

## 2019-09-10 ENCOUNTER — Other Ambulatory Visit: Payer: Self-pay

## 2019-09-10 ENCOUNTER — Emergency Department: Payer: Self-pay

## 2019-09-10 ENCOUNTER — Emergency Department
Admission: EM | Admit: 2019-09-10 | Discharge: 2019-09-10 | Disposition: A | Discharge status: Home / Self Care | Payer: Self-pay | Attending: Emergency Medicine | Admitting: Emergency Medicine

## 2019-09-10 DIAGNOSIS — Y929 Unspecified place or not applicable: Secondary | ICD-10-CM | POA: Insufficient documentation

## 2019-09-10 DIAGNOSIS — S93401A Sprain of unspecified ligament of right ankle, initial encounter: Secondary | ICD-10-CM | POA: Insufficient documentation

## 2019-09-10 DIAGNOSIS — F17210 Nicotine dependence, cigarettes, uncomplicated: Secondary | ICD-10-CM | POA: Insufficient documentation

## 2019-09-10 DIAGNOSIS — W109XXA Fall (on) (from) unspecified stairs and steps, initial encounter: Secondary | ICD-10-CM | POA: Insufficient documentation

## 2019-09-10 DIAGNOSIS — Y939 Activity, unspecified: Secondary | ICD-10-CM | POA: Insufficient documentation

## 2019-09-10 DIAGNOSIS — I10 Essential (primary) hypertension: Secondary | ICD-10-CM | POA: Insufficient documentation

## 2019-09-10 DIAGNOSIS — Y999 Unspecified external cause status: Secondary | ICD-10-CM | POA: Insufficient documentation

## 2019-09-10 MED ORDER — IBUPROFEN 400 MG PO TABS: 400.0000 mg | ORAL_TABLET | Freq: Four times a day (QID) | ORAL | 0 refills | Status: AC | PRN

## 2019-09-10 NOTE — ED Triage Notes (Signed)
Pt arrives via POV for c/o right ankle pain after tripping down the stairs on mothers day. Pt in NAD at this time. Pt has compression sock on right foot.

## 2019-09-10 NOTE — ED Provider Notes (Signed)
[facility] Emergency Department Provider Note  ____________________________________________  Time seen: Approximately 8:18 PM  I have reviewed the triage vital signs and the nursing notes.   HISTORY  Chief Complaint Ankle Pain    HPI Stephanie Franklin is a 49 y.o. female that presents to the emergency department for evaluation of right ankle pain after an injury 1 week ago.  Patient states that she tripped down about 3 steps and rolled her right ankle.  She called her primary care for an appointment but they did not do x-rays.  She is having pain and swelling to the outside of her ankle.  She is trying to limit the amount of pressure and walking she puts on her ankle.  She is concerned that she may have a sprain.  No additional injuries.  Past Medical History:  Diagnosis Date  . Cervical ca ([place]) 1990  . Hypertension   . UTI (urinary tract infection)     There are no problems to display for this patient.   Past Surgical History:  Procedure Laterality Date  . ANAL FISSURE REPAIR    . CESAREAN SECTION      Prior to Admission medications   Medication Sig Start Date End Date Taking? Authorizing Provider  baclofen (LIORESAL) 10 MG tablet Take 10 mg by mouth 3 (three) times daily as needed for muscle spasms.    [provider]  cephALEXin (KEFLEX) 500 MG capsule Take 1 capsule (500 mg total) by mouth 2 (two) times daily. Patient not taking: Reported on 07/06/2017 12/22/14   Menshew, Dannielle Karvonen, PA-C  diazepam (VALIUM) 5 MG tablet Take 1 tablet (5 mg total) by mouth every 8 (eight) hours as needed for muscle spasms. Patient not taking: Reported on 07/06/2017 07/01/15   Carrie Mew, MD  hydrochlorothiazide (HYDRODIURIL) 25 MG tablet Take 25 mg by mouth daily.    [provider]  ibuprofen (ADVIL) 400 MG tablet Take 1 tablet (400 mg total) by mouth every 6 (six) hours as needed. 09/10/19   Laban Emperor, PA-C  lidocaine (XYLOCAINE)  2 % jelly 1 application by Other route 2 (two) times daily. Rectally Patient not taking: Reported on 07/06/2017 12/12/15   Nance Pear, MD  lidocaine (XYLOCAINE) 2 % solution Use as directed 20 mLs in the mouth or throat as needed (Rectal). Patient not taking: Reported on 07/06/2017 12/12/15   Nance Pear, MD  Lidocaine, Anorectal, (RECTICARE) 5 % CREA Apply 1 application topically See admin instructions. Apply 1 application as directed daily to treat anal fissures    [provider]  LOSARTAN POTASSIUM PO Take 1 tablet by mouth daily.    [provider]  naproxen (NAPROSYN) 500 MG tablet Take 1 tablet (500 mg total) by mouth 2 (two) times daily with a meal. Patient not taking: Reported on 07/06/2017 07/01/15   Carrie Mew, MD  Nitroglycerin 0.4 % OINT Place 1 inch rectally 2 (two) times daily. Patient not taking: Reported on 07/06/2017 12/12/15   Nance Pear, MD  oxyCODONE-acetaminophen (PERCOCET) 5-325 MG tablet Take 2 tablets by mouth every 6 (six) hours as needed for moderate pain or severe pain. Patient not taking: Reported on 07/06/2017 11/25/15   Earleen Newport, MD  potassium chloride SA (K-DUR,KLOR-CON) 20 MEQ tablet Take 1 tablet (20 mEq total) by mouth 2 (two) times daily for 3 days. 07/06/17 07/09/17  Tobie Poet, DO  traMADol (ULTRAM) 50 MG tablet Take 50 mg by mouth every 6 (six) hours as needed (for  pain).    [provider]    Allergies Patient has no known allergies.  Family History  Problem Relation Age of Onset  . Breast cancer Mother        16's  . Breast cancer Paternal Aunt   . Breast cancer Other     Social History Social History   Tobacco Use  . Smoking status: Current Every Day Smoker    Packs/day: 0.50    Types: Cigarettes  . Smokeless tobacco: Never Used  Substance Use Topics  . Alcohol use: No  . Drug use: No     Review of Systems  Constitutional: No fever/chills Respiratory: No SOB. Gastrointestinal: No  nausea, no vomiting.  Musculoskeletal: Positive for ankle pain. Skin: Negative for rash, abrasions, lacerations, ecchymosis. Neurological: Negative for headaches   ____________________________________________   PHYSICAL EXAM:  VITAL SIGNS: ED Triage Vitals  Enc Vitals Group     BP 09/10/19 1822 (!) 141/85     Pulse Rate 09/10/19 1822 (!) 112     Resp 09/10/19 1822 20     Temp 09/10/19 1822 98.4 F (36.9 C)     Temp Source 09/10/19 1822 Oral     SpO2 09/10/19 1822 96 %     Weight 09/10/19 1822 179 lb (81.2 kg)     Height 09/10/19 1822 5\' 1"  (1.549 m)     Head Circumference --      Peak Flow --      Pain Score 09/10/19 1825 10     Pain Loc --      Pain Edu? --      Excl. in [place]? --      Constitutional: Alert and oriented. Well appearing and in no acute distress. Eyes: Conjunctivae are normal. PERRL. EOMI. Head: Atraumatic. ENT:      Ears:      Nose: No congestion/rhinnorhea.      Mouth/Throat: Mucous membranes are moist.  Neck: No stridor.  Cardiovascular: Normal rate, regular rhythm.  Good peripheral circulation.  Symmetric pedal pulses bilaterally. Respiratory: Normal respiratory effort without tachypnea or retractions. Lungs CTAB. Good air entry to the bases with no decreased or absent breath sounds. Musculoskeletal: Full range of motion to all extremities. No gross deformities appreciated.  Swelling and tenderness to palpation to right lateral malleolus.  Limited range of motion of ankle due to pain. Neurologic:  Normal speech and language. No gross focal neurologic deficits are appreciated.  Skin:  Skin is warm, dry and intact. No rash noted. Psychiatric: Mood and affect are normal. Speech and behavior are normal. Patient exhibits appropriate insight and judgement.   ____________________________________________   LABS (all labs ordered are listed, but only abnormal results are displayed)  Labs Reviewed - No data to  display ____________________________________________  EKG   ____________________________________________  RADIOLOGY Robinette Haines, personally viewed and evaluated these images (plain radiographs) as part of my medical decision making, as well as reviewing the written report by the radiologist.  DG Ankle Complete Right  Result Date: 09/10/2019 CLINICAL DATA:  Right ankle pain after trip down stairs on Mother's Day. EXAM: RIGHT ANKLE - COMPLETE 3+ VIEW COMPARISON:  None. FINDINGS: No acute or healing fracture. No dislocation. Normal mortise alignment. Mild soft tissue edema anteriorly but no definite joint effusion. There is a small plantar calcaneal spur. Mild anterior soft tissue edema. IMPRESSION: No acute or healing fracture of the right ankle. Mild anterior soft tissue edema. Electronically Signed   By: Keith Rake M.D.   On:  09/10/2019 18:55    ____________________________________________    PROCEDURES  Procedure(s) performed:    Procedures    Medications - No data to display   ____________________________________________   INITIAL IMPRESSION / ASSESSMENT AND PLAN / ED COURSE  Pertinent labs & imaging results that were available during my care of the patient were reviewed by me and considered in my medical decision making (see chart for details).  Review of the [place] CSRS was performed in accordance of the [place] prior to dispensing any controlled drugs.   Patient's diagnosis is consistent with ankle sprain.  Vital signs and exam are reassuring.  Ankle x-ray consistent with soft tissue swelling and no fracture.  Ankle splint was placed.  Crutches were given.  Patient will be discharged home with prescriptions for Motrin. Patient is to follow up with primary care orthopedics as directed. Patient is given ED precautions to return to the ED for any worsening or new symptoms.   Verdelle Pipp was evaluated in Emergency Department on 09/10/2019 for the symptoms  described in the history of present illness. She was evaluated in the context of the global COVID-19 pandemic, which necessitated consideration that the patient might be at risk for infection with the SARS-CoV-2 virus that causes COVID-19. Institutional protocols and algorithms that pertain to the evaluation of patients at risk for COVID-19 are in a state of rapid change based on information released by regulatory bodies including the CDC and federal and state organizations. These policies and algorithms were followed during the patient's care in the ED.  ____________________________________________  FINAL CLINICAL IMPRESSION(S) / ED DIAGNOSES  Final diagnoses:  Sprain of right ankle, unspecified ligament, initial encounter      NEW MEDICATIONS STARTED DURING THIS VISIT:  ED Discharge Orders         Ordered    ibuprofen (ADVIL) 400 MG tablet  Every 6 hours PRN     09/10/19 2204              This chart was dictated using voice recognition software/Dragon. Despite best efforts to proofread, errors can occur which can change the meaning. Any change was purely unintentional.    Laban Emperor, PA-C 09/10/19 2242    Arta Silence, MD 09/13/19 [phone]

## 2019-09-10 NOTE — ED Notes (Signed)
Reviewed discharge instructions, follow-up care, and prescriptions with patient. Patient verbalized understanding of all information reviewed. Patient stable, with no distress noted at this time.    

## 2019-12-31 ENCOUNTER — Ambulatory Visit: Payer: Self-pay | Attending: Oncology | Admitting: *Deleted

## 2019-12-31 ENCOUNTER — Ambulatory Visit
Admission: RE | Admit: 2019-12-31 | Discharge: 2019-12-31 | Disposition: A | Discharge status: Home / Self Care | Payer: Self-pay | Source: Ambulatory Visit | Attending: Oncology | Admitting: Oncology

## 2019-12-31 ENCOUNTER — Encounter: Payer: Self-pay | Admitting: *Deleted

## 2019-12-31 ENCOUNTER — Other Ambulatory Visit: Payer: Self-pay

## 2019-12-31 VITALS — BP 122/86 | HR 86 | Temp 97.9°F | Ht 60.5 in | Wt 175.7 lb

## 2019-12-31 DIAGNOSIS — Z0000 Encounter for general adult medical examination without abnormal findings: Secondary | ICD-10-CM

## 2019-12-31 NOTE — Progress Notes (Signed)
Subjective:     Patient ID: Stephanie Franklin, female   DOB: 1971-04-19, 49 y.o.   MRN: [id number]  HPI   [facility] Medical History Record - 12/31/19 1356      Breast History   Screening cycle New    CBE Date 08/08/17    Provider (CBE) [facility]    Last Mammogram Annual    Last Mammogram Date 08/08/17    Provider (Mammogram)  [facility]i    Recent Breast Symptoms None      Breast Cancer History   Breast Cancer History Patient and mother/daughter/sister have had breast cancer    Comments/Details Mother had breast cancer dx at age 50      Previous History of Breast Problems   Breast Surgery or Biopsy None    Breast Implants N/A    BSE Done Monthly      Gynecological/Obstetrical History   LMP 09/30/19    Is there any chance that the client could be pregnant?  No    Age at menarche 49    Age at menopause perimenopausal    PAP smear history See comments   per recommendations   Date of last PAP  12/10/13    Provider (PAP) [facility]    Age at first live birth 14    Breast fed children No    DES Exposure Unkown    Cervical, Uterine or Ovarian cancer Yes   cervical cancer - age 49 - surgical intervention   Family history of Cervial, Uterine or Ovarian cancer No    Hysterectomy No    Cervix removed No    Ovaries removed No    Laser/Cryosurgery Yes    Current method of birth control None    Current method of Estrogen/Hormone replacement None    Smoking history Yes    Comments No ins / 2 in [facility] / income unknown - pt does not work and lives with boyfriend who will not disclose his income.             Review of Systems     Objective:   Physical Exam Chest:     Breasts:        Right: No swelling, bleeding, inverted nipple, mass, nipple discharge, skin change or tenderness.        Left: No swelling, bleeding, inverted nipple, mass, nipple discharge, skin change or tenderness.  Abdominal:     Palpations: There is no hepatomegaly or splenomegaly.  Genitourinary:    Exam position:  Lithotomy position.     Labia:        Right: No rash, tenderness, lesion or injury.        Left: No rash, tenderness, lesion or injury.      Urethra: No prolapse, urethral pain, urethral swelling or urethral lesion.     Cervix: No cervical motion tenderness, discharge, friability, lesion, erythema, cervical bleeding or eversion.     Uterus: Not deviated, not enlarged, not fixed, not tender and no uterine prolapse.      Adnexa:        Right: No mass.         Left: No mass.    Lymphadenopathy:     Upper Body:     Right upper body: No supraclavicular or axillary adenopathy.     Left upper body: No supraclavicular or axillary adenopathy.        Assessment:     49 year old female returns to [facility] for annual screening.  Clinical breast exam without dominant  mass, skin changes, nipple discharge, or lymphadenopathy.  Taught self breast awareness.  Specimen collected for pap smear without difficulty. Patient has been screened for eligibility.  She does not have any insurance, Medicare or Medicaid.  She also meets financial eligibility.  Risk Assessment    Risk Scores      12/31/2019   Last edited by: Orson Slick, CMA   5-year risk: 2 %   Lifetime risk: 17.9 %             Plan:     Screening mammogram ordered.  Specimen for pap sent to the lab.  Will follow up per B[facility]CP protocol.

## 2019-12-31 NOTE — Patient Instructions (Signed)
Gave patient hand-out, Women Staying Healthy, Active and Well from [facility], with education on breast health, pap smears, heart and colon health. 

## 2020-01-02 LAB — IGP, APTIMA HPV: HPV Aptima: NEGATIVE

## 2020-01-08 NOTE — Progress Notes (Signed)
Phoned patient with [facility] 1 mammogram results, and negative/negative pap results.  Next pap due in five years. Letter mailed from [facility] to notify of normal mammogram results.  Patient to return in one year for annual screening.  Copy to [facility].

## 2020-01-15 ENCOUNTER — Encounter: Payer: Self-pay | Admitting: *Deleted

## 2020-01-15 NOTE — Progress Notes (Signed)
Letter mailed to inform patient of her normal mammogram and pap smear.  Next mammo in 1 year and pap in 5 years.

## 2020-02-28 IMAGING — MG MM DIGITAL SCREENING BILAT W/ TOMO W/ CAD
9 of 14 series · 9 of 30 positions shown · non-contrast
Comparison: Previous exam(s).

CLINICAL DATA: Screening.

EXAM:
DIGITAL SCREENING BILATERAL MAMMOGRAM WITH TOMO AND CAD

[R XCCL]
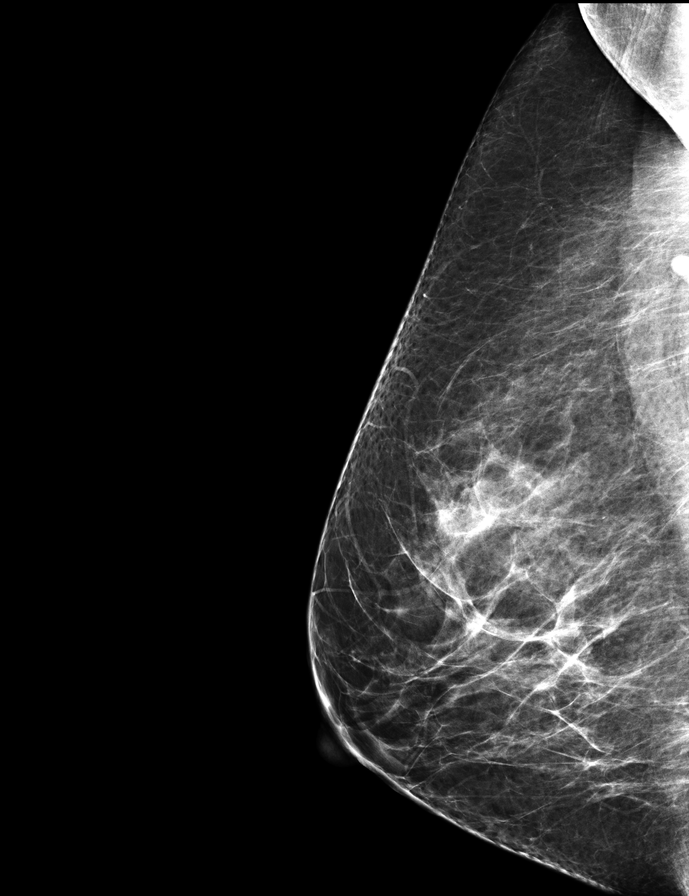

[L MLO (1 of 2)]
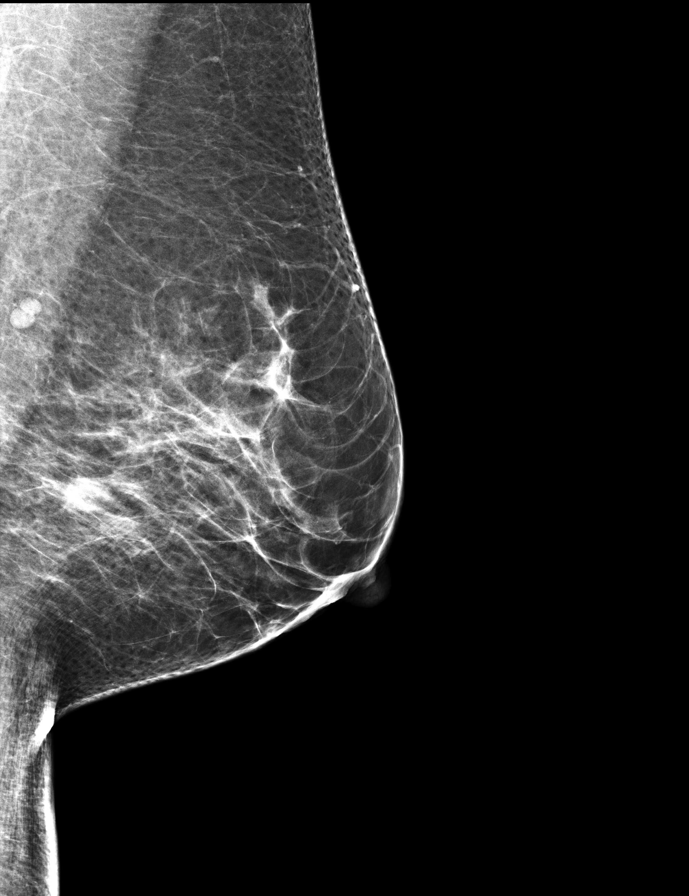

[R CC synth-2D]
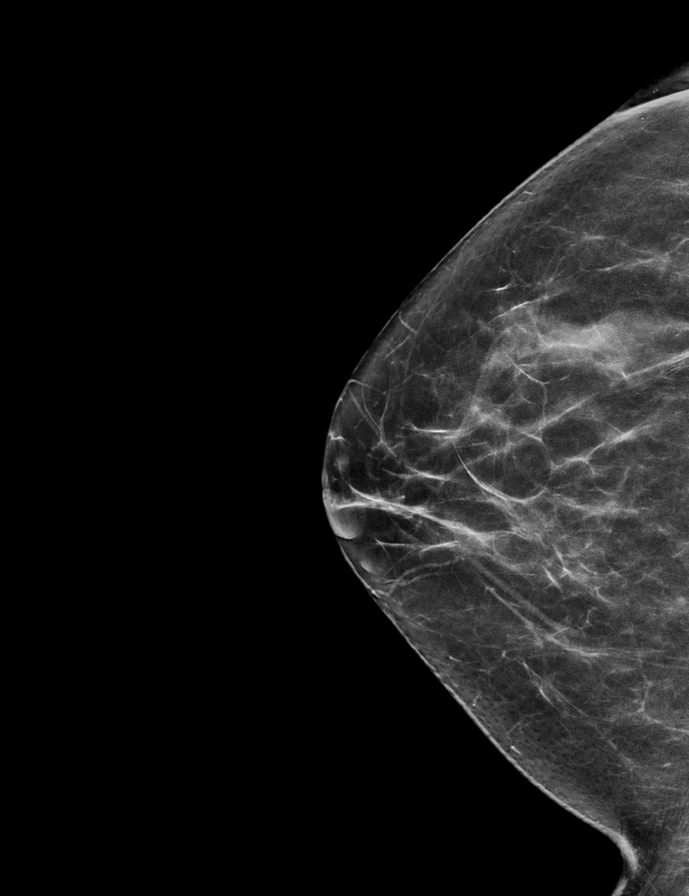

[L MLO synth-2D]
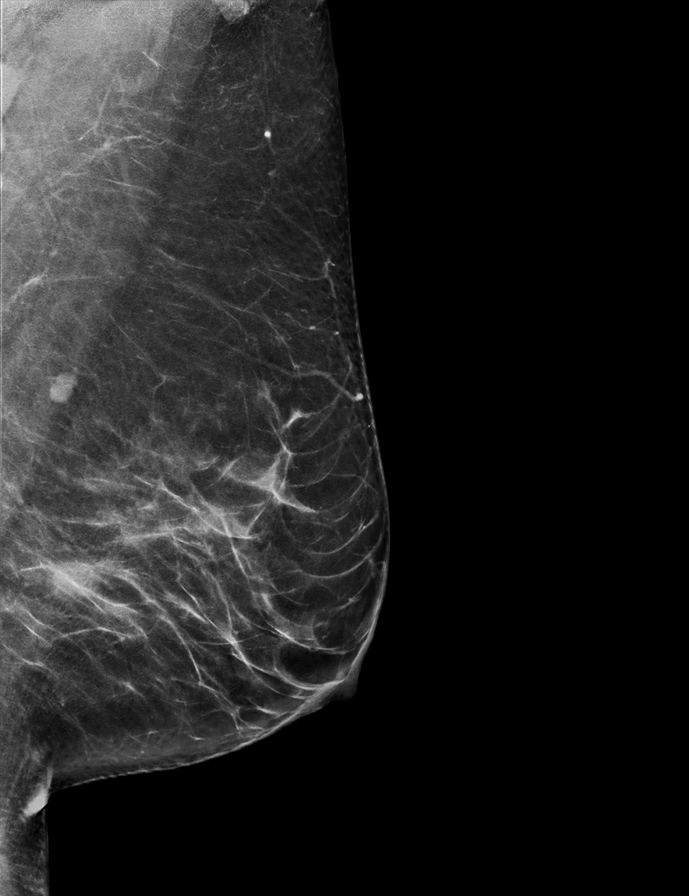

[L MLO (2 of 2)]
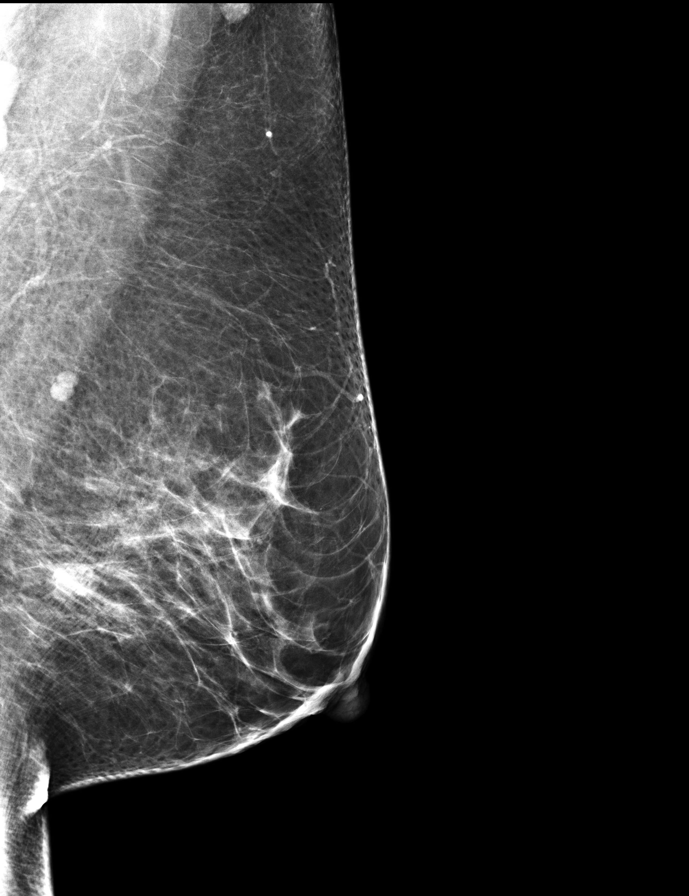

[L CC synth-2D]
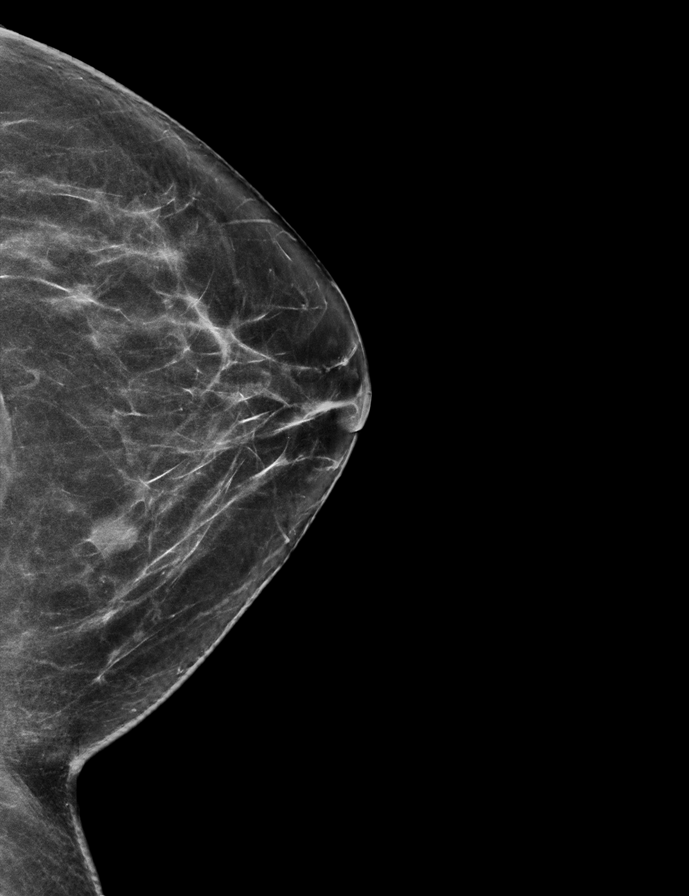

[L CC]
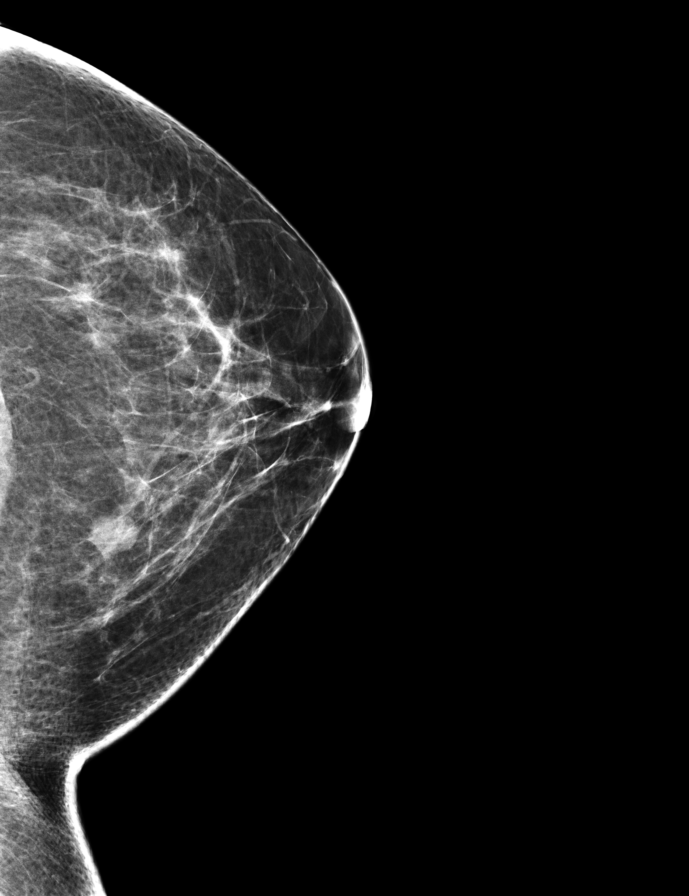

[R CC]
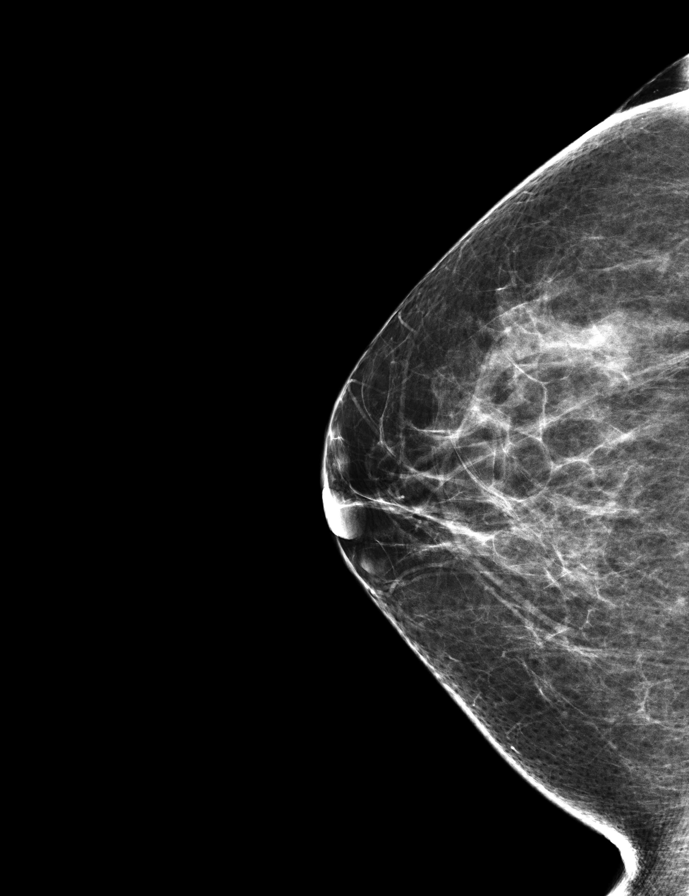

[R MLO]
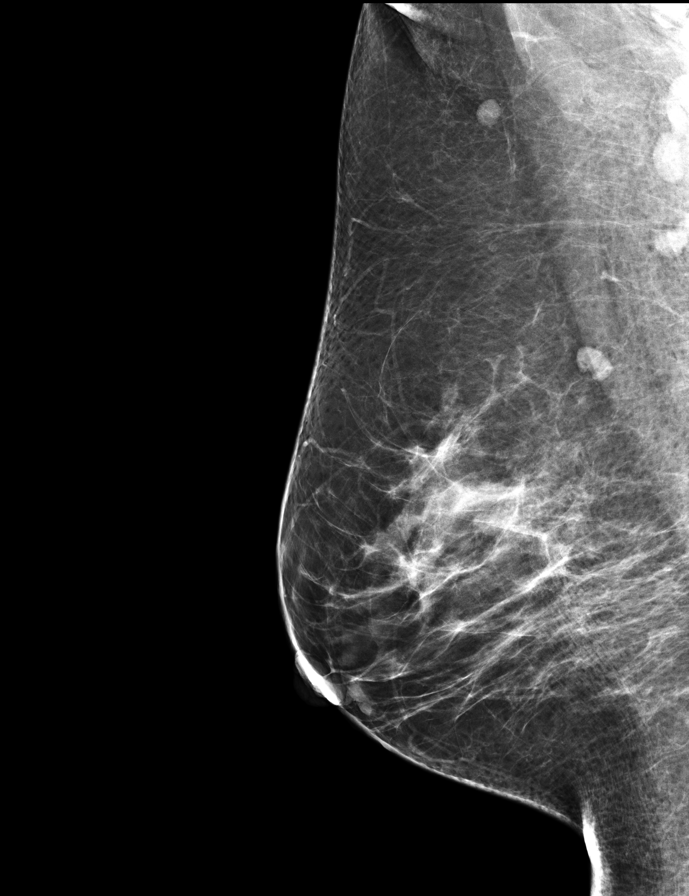

[9 of 30 positions shown; findings below may reference images not displayed]

ACR Breast Density Category b: There are scattered areas of
fibroglandular density.
FINDINGS: There are no findings suspicious for malignancy. Images were
processed with CAD.
IMPRESSION: No mammographic evidence of malignancy. A result letter of this
screening mammogram will be mailed directly to the patient.

RECOMMENDATION:
Screening mammogram in one year. (Code:[id number])

BI-RADS CATEGORY  1: Negative.

## 2021-03-08 NOTE — Progress Notes (Signed)
Patient pre-screened for BCCCP eligibility due to COVID 19 precautions. Two patient identifiers used for verification that I was speaking to correct patient.  Patient to Present directly to [facility] 02/06/21 for BCCCP screening mammogram.

## 2021-03-09 ENCOUNTER — Ambulatory Visit: Payer: Self-pay | Attending: Oncology

## 2021-03-09 ENCOUNTER — Other Ambulatory Visit: Payer: Self-pay

## 2021-03-09 ENCOUNTER — Ambulatory Visit
Admission: RE | Admit: 2021-03-09 | Discharge: 2021-03-09 | Disposition: A | Discharge status: Home / Self Care | Payer: Self-pay | Source: Ambulatory Visit | Attending: Oncology | Admitting: Oncology

## 2021-03-09 DIAGNOSIS — Z0000 Encounter for general adult medical examination without abnormal findings: Secondary | ICD-10-CM

## 2021-03-10 NOTE — Progress Notes (Signed)
Letter mailed from [facility] to notify of normal mammogram results.  Patient to return in one year for annual screening.  Copy to [facility]. 

## 2022-03-11 ENCOUNTER — Emergency Department
Admission: EM | Admit: 2022-03-11 | Discharge: 2022-03-11 | Disposition: A | Discharge status: Home / Self Care | Payer: Self-pay | Attending: Emergency Medicine | Admitting: Emergency Medicine

## 2022-03-11 ENCOUNTER — Emergency Department: Payer: Self-pay

## 2022-03-11 ENCOUNTER — Other Ambulatory Visit: Payer: Self-pay

## 2022-03-11 DIAGNOSIS — R1030 Lower abdominal pain, unspecified: Secondary | ICD-10-CM | POA: Insufficient documentation

## 2022-03-11 LAB — CBC WITH DIFFERENTIAL/PLATELET
Abs Immature Granulocytes: 0.02 10*3/uL (ref 0.00–0.07)
Basophils Absolute: 0.1 10*3/uL (ref 0.0–0.1)
Basophils Relative: 1 %
Eosinophils Absolute: 0.1 10*3/uL (ref 0.0–0.5)
Eosinophils Relative: 1 %
HCT: 37.9 % (ref 36.0–46.0)
Hemoglobin: 12.7 g/dL (ref 12.0–15.0)
Immature Granulocytes: 0 %
Lymphocytes Relative: 35 %
Lymphs Abs: 3 10*3/uL (ref 0.7–4.0)
MCH: 30.9 pg (ref 26.0–34.0)
MCHC: 33.5 g/dL (ref 30.0–36.0)
MCV: 92.2 fL (ref 80.0–100.0)
Monocytes Absolute: 0.4 10*3/uL (ref 0.1–1.0)
Monocytes Relative: 4 %
Neutro Abs: 5 10*3/uL (ref 1.7–7.7)
Neutrophils Relative %: 59 %
Platelets: 277 10*3/uL (ref 150–400)
RBC: 4.11 MIL/uL (ref 3.87–5.11)
RDW: 12.5 % (ref 11.5–15.5)
WBC: 8.5 10*3/uL (ref 4.0–10.5)
nRBC: 0 % (ref 0.0–0.2)

## 2022-03-11 LAB — COMPREHENSIVE METABOLIC PANEL
ALT: 11 U/L (ref 0–44)
AST: 14 U/L — ABNORMAL LOW (ref 15–41)
Albumin: 3.8 g/dL (ref 3.5–5.0)
Alkaline Phosphatase: 64 U/L (ref 38–126)
Anion gap: 11 (ref 5–15)
BUN: 18 mg/dL (ref 6–20)
CO2: 23 mmol/L (ref 22–32)
Calcium: 9.1 mg/dL (ref 8.9–10.3)
Chloride: 106 mmol/L (ref 98–111)
Creatinine, Ser: 0.96 mg/dL (ref 0.44–1.00)
GFR, Estimated: >60 mL/min (ref >60)
Glucose, Bld: 98 mg/dL (ref 70–99)
Potassium: 3.2 mmol/L — ABNORMAL LOW (ref 3.5–5.1)
Sodium: 140 mmol/L (ref 135–145)
Total Bilirubin: 0.4 mg/dL (ref 0.3–1.2)
Total Protein: 7 g/dL (ref 6.5–8.1)

## 2022-03-11 LAB — URINALYSIS, ROUTINE W REFLEX MICROSCOPIC
Bacteria, UA: NONE SEEN
Bilirubin Urine: NEGATIVE
Glucose, UA: NEGATIVE mg/dL
Ketones, ur: NEGATIVE mg/dL
Leukocytes,Ua: NEGATIVE
Nitrite: NEGATIVE
Protein, ur: NEGATIVE mg/dL
Specific Gravity, Urine: 1.02 (ref 1.005–1.030)
pH: 6 (ref 5.0–8.0)

## 2022-03-11 LAB — LIPASE, BLOOD: Lipase: 56 U/L — ABNORMAL HIGH (ref 11–51)

## 2022-03-11 LAB — POC URINE PREG, ED: Preg Test, Ur: NEGATIVE

## 2022-03-11 MED ORDER — BACITRACIN ZINC 500 UNIT/GM EX OINT: TOPICAL_OINTMENT | Freq: Once | CUTANEOUS | Status: DC

## 2022-03-11 MED ORDER — DICYCLOMINE HCL 10 MG PO CAPS: 10.0000 mg | ORAL_CAPSULE | Freq: Three times a day (TID) | ORAL | 0 refills | Status: AC | PRN

## 2022-03-11 MED ORDER — POTASSIUM CHLORIDE CRYS ER 20 MEQ PO TBCR
40.0000 meq | EXTENDED_RELEASE_TABLET | Freq: Once | ORAL | Status: AC
  Administered 2022-03-11: 40 meq via ORAL
  Filled 2022-03-11: qty 2

## 2022-03-11 MED ORDER — IOHEXOL 300 MG/ML  SOLN
100.0000 mL | Freq: Once | INTRAMUSCULAR | Status: AC | PRN
  Administered 2022-03-11: 100 mL via INTRAVENOUS

## 2022-03-11 MED ORDER — DICYCLOMINE HCL 10 MG PO CAPS
10.0000 mg | ORAL_CAPSULE | Freq: Once | ORAL | Status: AC
  Administered 2022-03-11: 10 mg via ORAL
  Filled 2022-03-11: qty 1

## 2022-03-11 NOTE — ED Triage Notes (Signed)
Pt arrives with c/o lower ABD pain that started a few days ago. Pt denies n/v or fevers. Last BM was 03/11/2022.

## 2022-03-11 NOTE — ED Provider Triage Note (Signed)
Emergency Medicine Provider Triage Evaluation Note  Stephanie Franklin , a 51 y.o. female  was evaluated in triage.  Pt complains of lower abd pain. No urinary changes. No diarrhea or constipation. Diffuse lower abd pain.  Review of Systems  Positive: Abd pain Negative: Urinary changes, constipation, diarrhea, fever, chills  Physical Exam  BP 126/84   Pulse 82   Temp 98.5 F (36.9 C) (Oral)   Resp 18   Ht 5' (1.524 m)   Wt 79.4 kg   LMP 06/15/2017   SpO2 100%   BMI 34.18 kg/m  Gen:   Awake, no distress   Resp:  Normal effort  MSK:   Moves extremities without difficulty  Other:    Medical Decision Making  Medically screening exam initiated at 5:06 PM.  Appropriate orders placed.  Stephanie Franklin was informed that the remainder of the evaluation will be completed by another provider, this initial triage assessment does not replace that evaluation, and the importance of remaining in the ED until their evaluation is complete.  Lower abd pain x several days. Labs, urinalysis at this time   Darletta Moll, Hershal Coria 03/11/22 1706

## 2022-03-11 NOTE — Discharge Instructions (Signed)
Please seek medical attention for any high fevers, chest pain, shortness of breath, change in behavior, persistent vomiting, bloody stool or any other new or concerning symptoms.  

## 2022-03-11 NOTE — ED Provider Notes (Signed)
[facility] Provider Note    Event Date/Time   First MD Initiated Contact with Patient 03/11/22 1946     (approximate)   History   Abdominal Pain   HPI  Stephanie Franklin is a 51 y.o. female who presented to the emergency department today because of concerns for abdominal pain.  Started about 5 days ago.  Located in her lower abdomen.  She describes it as cramping.  It has been fairly present over the past 5 days.  She has not noticed anything that necessarily makes significantly better or worse.  The patient states she had gastritis when she was 17 and this reminds her of that.  She denies any change in her bowel or bladder habits. Denies any fevers or chills.      Physical Exam   Triage Vital Signs: ED Triage Vitals [03/11/22 1704]  Enc Vitals Group     BP 126/84     Pulse Rate 82     Resp 18     Temp 98.5 F (36.9 C)     Temp Source Oral     SpO2 100 %     Weight 175 lb (79.4 kg)     Height 5' (1.524 m)     Head Circumference      Peak Flow      Pain Score 10     Pain Loc      Pain Edu?      Excl. in [place]?     Most recent vital signs: Vitals:   03/11/22 1704  BP: 126/84  Pulse: 82  Resp: 18  Temp: 98.5 F (36.9 C)  SpO2: 100%   General: Awake, alert, oriented. CV:  Good peripheral perfusion. Regular rate and rhythm. Resp:  Normal effort. Lungs clear. Abd:  No distention. Diffusely tender to palpation.   ED Results / Procedures / Treatments   Labs (all labs ordered are listed, but only abnormal results are displayed) Labs Reviewed  URINALYSIS, ROUTINE W REFLEX MICROSCOPIC - Abnormal; Notable for the following components:      Result Value   Color, Urine YELLOW (*)    APPearance CLEAR (*)    Hgb urine dipstick SMALL (*)    All other components within normal limits  COMPREHENSIVE METABOLIC PANEL - Abnormal; Notable for the following components:   Potassium 3.2 (*)    AST 14 (*)    All other components within normal  limits  LIPASE, BLOOD - Abnormal; Notable for the following components:   Lipase 56 (*)    All other components within normal limits  CBC WITH DIFFERENTIAL/PLATELET  POC URINE PREG, ED     EKG  None   RADIOLOGY I independently interpreted and visualized the ct abd/pel. My interpretation: No free air. No large amount of free fluid.  Radiology interpretation:  IMPRESSION:  1. No acute localizing process in the abdomen or pelvis.  2. Colonic diverticulosis without evidence for diverticulitis.  3. Uterine fibroid.     PROCEDURES:  Critical Care performed: No  Procedures   MEDICATIONS ORDERED IN ED: Medications - No data to display   IMPRESSION / MDM / [place] / ED COURSE  I reviewed the triage vital signs and the nursing notes.                              Differential diagnosis includes, but is not limited to, gastroenteritis, UTI, perforation.  Patient's presentation is most consistent with acute presentation with potential threat to life or bodily function.  Patient presented to the emergency department today because of concerns for abdominal pain.  On exam patient without any significant tenderness.  Blood work without concerning leukocytosis of did obtain a CT scan which did not show any concerning findings.  UA is not consistent with urinary tract infection.  Patient did get some relief from Bentyl.  At this time unclear etiology but given improvement and reassuring imaging I do think it is reasonable for patient be discharged home.  Will give patient prescription for Bentyl.  Discussed return precautions and will give GI follow-up.  FINAL CLINICAL IMPRESSION(S) / ED DIAGNOSES   Final diagnoses:  Lower abdominal pain    Note:  This document was prepared using Dragon voice recognition software and may include unintentional dictation errors.    Nance Pear, MD 03/11/22 2308

## 2023-11-02 ENCOUNTER — Other Ambulatory Visit: Payer: Self-pay

## 2023-11-02 ENCOUNTER — Telehealth: Payer: Self-pay

## 2023-11-02 ENCOUNTER — Emergency Department: Payer: Self-pay

## 2023-11-02 ENCOUNTER — Emergency Department
Admission: EM | Admit: 2023-11-02 | Discharge: 2023-11-02 | Disposition: A | Discharge status: Home / Self Care | Payer: Self-pay | Attending: Emergency Medicine | Admitting: Emergency Medicine

## 2023-11-02 DIAGNOSIS — I10 Essential (primary) hypertension: Secondary | ICD-10-CM | POA: Insufficient documentation

## 2023-11-02 DIAGNOSIS — Z8541 Personal history of malignant neoplasm of cervix uteri: Secondary | ICD-10-CM | POA: Insufficient documentation

## 2023-11-02 DIAGNOSIS — W19XXXA Unspecified fall, initial encounter: Secondary | ICD-10-CM | POA: Insufficient documentation

## 2023-11-02 DIAGNOSIS — S2242XA Multiple fractures of ribs, left side, initial encounter for closed fracture: Secondary | ICD-10-CM | POA: Insufficient documentation

## 2023-11-02 NOTE — Discharge Instructions (Addendum)
 Your workup today showed that you have a fracture to one or more ribs.  Unfortunately this type of injury hurts but there is no way to fix it immediately; it must heal over time.  Be sure to take plenty of deep breaths so that you get rid of the bad air in your lungs.  If you are given a device called an incentive spirometer, please use it as recommended.  Unless you have been told by your doctor not to do so, we recommend you take ibuprofen  600 mg 3 times daily with meals for no more than 5 days.  You can also take Tylenol  1000 mg every 6 hours for pain.  Follow-up at the clinics or with the doctors described in this paperwork.  Return to the emergency department if you develop new or worsening symptoms that concern you.   Rib Fracture A rib fracture is a break or crack in one of the bones of the ribs. The ribs are a group of long, curved bones that wrap around your chest and attach to your spine. They protect your lungs and other organs in the chest cavity. A broken or cracked rib is often painful, but most do not cause other problems. Most rib fractures heal on their own over time. However, rib fractures can be more serious if multiple ribs are broken or if broken ribs move out of place and push against other structures. CAUSES  A direct blow to the chest. For example, this could happen during contact sports, a car accident, or a fall against a hard object. Repetitive movements with high force, such as pitching a baseball or having severe coughing spells. SYMPTOMS  Pain when you breathe in or cough. Pain when someone presses on the injured area. DIAGNOSIS  Your caregiver will perform a physical exam. Various imaging tests may be ordered to confirm the diagnosis and to look for related injuries. These tests may include a chest X-ray, computed tomography (CT), magnetic resonance imaging (MRI), or a bone scan. TREATMENT  Rib fractures usually heal on their own in 1-3 months. The longer healing  period is often associated with a continued cough or other aggravating activities. During the healing period, pain control is very important. Medication is usually given to control pain. Hospitalization or surgery may be needed for more severe injuries, such as those in which multiple ribs are broken or the ribs have moved out of place.  HOME CARE INSTRUCTIONS  Avoid strenuous activity and any activities or movements that cause pain. Be careful during activities and avoid bumping the injured rib. Gradually increase activity as directed by your caregiver. Only take over-the-counter or prescription medications as directed by your caregiver. Do not take other medications without asking your caregiver first. Apply ice to the injured area for the first 1-2 days after you have been treated or as directed by your caregiver. Applying ice helps to reduce inflammation and pain. Put ice in a plastic bag. Place a towel between your skin and the bag.   Leave the ice on for 15-20 minutes at a time, every 2 hours while you are awake. Perform deep breathing as directed by your caregiver. This will help prevent pneumonia, which is a common complication of a broken rib. Your caregiver may instruct you to: Take deep breaths several times a day. Try to cough several times a day, holding a pillow against the injured area. Use a device called an incentive spirometer to practice deep breathing several times a day. Drink  enough fluids to keep your urine clear or pale yellow. This will help you avoid constipation.   Do not wear a rib belt or binder. These restrict breathing, which can lead to pneumonia.   SEEK IMMEDIATE MEDICAL CARE IF:  You have a fever.   You have difficulty breathing or shortness of breath.   You develop a continual cough, or you cough up thick or bloody sputum. You feel sick to your stomach (nausea), throw up (vomit), or have abdominal pain.   You have worsening pain not controlled with medications.    MAKE SURE YOU: Understand these instructions. Will watch your condition. Will get help right away if you are not doing well or get worse. Document Released: 04/10/2005 Document Revised: 12/11/2012 Document Reviewed: 06/12/2012 [facility] Patient Information 2015 [place], [place]. This information is not intended to replace advice given to you by your health care provider. Make sure you discuss any questions you have with your health care provider.

## 2023-11-02 NOTE — Telephone Encounter (Cosign Needed)
 Placed telephone call with patient. Will send incentive spirometer to [facility] in [place] since [facility] did not have the order.

## 2023-11-02 NOTE — ED Triage Notes (Signed)
 Pt comes with c/o fall week ago. Pt states left side and rib pain. Pt states some hip pain also. Pt denies any loc or hitting her head.

## 2023-11-02 NOTE — ED Provider Notes (Signed)
 [facility] Provider Note    Event Date/Time   First MD Initiated Contact with Patient 11/02/23 1145     (approximate)   History   Fall   HPI  Stephanie Franklin is a 53 y.o. female with a past medical history of hypertension, cervical cancer presents to the emergency department with left side/rib pain for roughly 2 weeks.  Patient states she had a fall on June 29 off of her couch when she hit her left side on the side of a table and now has some bruising and pain in that area.  Has increased pain with breathing.  Denies chest pain, shortness of breath, loss of consciousness, hitting her head, headache.  Patient does already use ibuprofen , baclofen, tramadol, and oxycodone  at home for pain.  She also purchased rib binder which has helped some.  No allergies.      Physical Exam   Triage Vital Signs: ED Triage Vitals [11/02/23 1125]  Encounter Vitals Group     BP (!) 124/97     Girls Systolic BP Percentile      Girls Diastolic BP Percentile      Boys Systolic BP Percentile      Boys Diastolic BP Percentile      Pulse Rate (!) 105     Resp 18     Temp 98 F (36.7 C)     Temp src      SpO2 100 %     Weight 150 lb (68 kg)     Height 5' (1.524 m)     Head Circumference      Peak Flow      Pain Score 10     Pain Loc      Pain Education      Exclude from Growth Chart     Most recent vital signs: Vitals:   11/02/23 1125  BP: (!) 124/97  Pulse: (!) 105  Resp: 18  Temp: 98 F (36.7 C)  SpO2: 100%    General: Awake, in no acute distress. Appears stated age. Head: Normocephalic, atraumatic. Neck: Supple. CV: Peripheral pulses 2+ and symmetric. No edema. Respiratory: Breath sounds clear b/l. No wheezes, rales, or rhonchi. No respiratory distress. Normal respiratory effort.  Able to take deep breaths. GI: Soft, non-distended.  MSK: Tender along the anterolateral aspects of ribs 6 and 7 on the left side. Skin:Warm, dry, intact.  Ecchymosis  present on the lateral aspect of ribs 6 and 7 on the left side. Neurological: A&Ox4 to person, place, time, and situation.   ED Results / Procedures / Treatments   Labs (all labs ordered are listed, but only abnormal results are displayed) Labs Reviewed - No data to display   EKG     RADIOLOGY  IMPRESSION: Minimally displaced left sixth and seventh rib fractures.  Imaging was independently viewed and interpreted by me as well as the radiologist. I agree with the radiologist's report.  PROCEDURES:  Critical Care performed: No   Procedures   MEDICATIONS ORDERED IN ED: Medications - No data to display   IMPRESSION / MDM / ASSESSMENT AND PLAN / ED COURSE  I reviewed the triage vital signs and the nursing notes.                              Differential diagnosis includes, but is not limited to, rib fracture, rib contusion, pneumothorax  Patient's presentation is most consistent with acute  complicated illness / injury requiring diagnostic workup.  Patient is a 53 year old female who presented today with left rib pain.  Physical exam shows pain with palpation of the anterior lateral portions of 6th and 7th ribs.  X-ray ordered and shows minimally displaced left 6th and 7th ribs; no evidence of pneumothorax.  She can use Tylenol  and/or ibuprofen  at home for pain.  Was prescribed an incentive spirometer.  She can follow-up with her primary care provider in 4 weeks to see if she is healing well or in the emergency department for any new or worsening symptoms.  Patient was given the opportunity to ask questions; all questions were answered. Emergency department return precautions were discussed with the patient.  Patient is in agreement to the treatment plan.  Patient is stable for discharge.    FINAL CLINICAL IMPRESSION(S) / ED DIAGNOSES   Final diagnoses:  Closed traumatic minimally displaced fracture of two ribs of left side, initial encounter     Rx / DC Orders   ED  Discharge Orders          Ordered    Incentive spirometry RT        11/02/23 1247             Note:  This document was prepared using Dragon voice recognition software and may include unintentional dictation errors.     Sheron Salm, PA-C 11/02/23 1302    Malvina Alm DASEN, MD 11/03/23 [phone]

## 2023-12-06 ENCOUNTER — Encounter: Payer: Self-pay | Admitting: Family Medicine

## 2023-12-10 ENCOUNTER — Telehealth: Payer: Self-pay | Admitting: *Deleted

## 2024-02-15 ENCOUNTER — Other Ambulatory Visit: Payer: Self-pay

## 2024-02-15 ENCOUNTER — Emergency Department
Admission: EM | Admit: 2024-02-15 | Discharge: 2024-02-16 | Disposition: A | Discharge status: Home / Self Care | Payer: Self-pay | Attending: Emergency Medicine | Admitting: Emergency Medicine

## 2024-02-15 DIAGNOSIS — Y990 Civilian activity done for income or pay: Secondary | ICD-10-CM | POA: Insufficient documentation

## 2024-02-15 DIAGNOSIS — S39012A Strain of muscle, fascia and tendon of lower back, initial encounter: Secondary | ICD-10-CM | POA: Insufficient documentation

## 2024-02-15 DIAGNOSIS — X501XXA Overexertion from prolonged static or awkward postures, initial encounter: Secondary | ICD-10-CM | POA: Insufficient documentation

## 2024-02-15 LAB — CBC
HCT: 35.8 % — ABNORMAL LOW (ref 36.0–46.0)
Hemoglobin: 12.1 g/dL (ref 12.0–15.0)
MCH: 31.1 pg (ref 26.0–34.0)
MCHC: 33.8 g/dL (ref 30.0–36.0)
MCV: 92 fL (ref 80.0–100.0)
Platelets: 276 K/uL (ref 150–400)
RBC: 3.89 MIL/uL (ref 3.87–5.11)
RDW: 12.7 % (ref 11.5–15.5)
WBC: 6.7 K/uL (ref 4.0–10.5)
nRBC: 0 % (ref 0.0–0.2)

## 2024-02-15 LAB — URINALYSIS, ROUTINE W REFLEX MICROSCOPIC
Bacteria, UA: NONE SEEN
Bilirubin Urine: NEGATIVE
Glucose, UA: NEGATIVE mg/dL
Hgb urine dipstick: NEGATIVE
Ketones, ur: NEGATIVE mg/dL
Leukocytes,Ua: NEGATIVE
Nitrite: NEGATIVE
Protein, ur: 30 mg/dL — AB
Specific Gravity, Urine: 1.033 — ABNORMAL HIGH (ref 1.005–1.030)
pH: 5 (ref 5.0–8.0)

## 2024-02-15 LAB — BASIC METABOLIC PANEL WITH GFR
Anion gap: 11 (ref 5–15)
BUN: 14 mg/dL (ref 6–20)
CO2: 24 mmol/L (ref 22–32)
Calcium: 8.6 mg/dL — ABNORMAL LOW (ref 8.9–10.3)
Chloride: 104 mmol/L (ref 98–111)
Creatinine, Ser: 1.11 mg/dL — ABNORMAL HIGH (ref 0.44–1.00)
GFR, Estimated: 59 mL/min — ABNORMAL LOW (ref >60)
Glucose, Bld: 120 mg/dL — ABNORMAL HIGH (ref 70–99)
Potassium: 3.3 mmol/L — ABNORMAL LOW (ref 3.5–5.1)
Sodium: 139 mmol/L (ref 135–145)

## 2024-02-15 NOTE — ED Triage Notes (Addendum)
 Pt with lumbar back pain. Hx of bursitis in hips but states this feels different. Taken prescribed oxycodone  and tramadol without relief. Denies known injury. Denies urinary symptoms. Denies fevers, nausea, vomiting. Pain increased with movement.

## 2024-02-16 MED ORDER — LIDOCAINE 5 % EX PTCH: 1.0000 | MEDICATED_PATCH | Freq: Two times a day (BID) | CUTANEOUS | 0 refills | Status: AC

## 2024-02-16 MED ORDER — PREDNISONE 10 MG PO TABS: ORAL_TABLET | ORAL | 0 refills | Status: AC

## 2024-02-16 MED ORDER — LIDOCAINE 5 % EX PTCH
2.0000 | MEDICATED_PATCH | CUTANEOUS | Status: DC
  Administered 2024-02-16: 2 via TRANSDERMAL
  Filled 2024-02-16: qty 2

## 2024-02-16 MED ORDER — CYCLOBENZAPRINE HCL 10 MG PO TABS
5.0000 mg | ORAL_TABLET | Freq: Once | ORAL | Status: AC
  Administered 2024-02-16: 5 mg via ORAL
  Filled 2024-02-16: qty 1

## 2024-02-16 MED ORDER — KETOROLAC TROMETHAMINE 30 MG/ML IJ SOLN
30.0000 mg | Freq: Once | INTRAMUSCULAR | Status: AC
  Administered 2024-02-16: 30 mg via INTRAMUSCULAR
  Filled 2024-02-16: qty 1

## 2024-02-16 NOTE — Discharge Instructions (Addendum)
 You were evaluated in the Emergency Department today for back pain. Your evaluation suggests no acute abnormalities which require further intervention at this time.   - Move around as tolerated but avoiding heavy lifting. "Bed rest" is not recommended nor is it the best treatment for low back pain.  - Medications will help control your discomfort: -- Ibuprofen (800 mg every 8 hours for pain). -- Any other prescriptions you were provided as per label instructions -- Do not drink alcohol, drive a car, operate machinery, or get up on ladders or heights when taking any prescribed pain medications. -- Do not drive home if you received prescribed pain medications here in the ED.  Please follow up with your primary care physician as needed or any other providers listed in this paperwork. If you do not have a primary doctor, you can call your insurance company to find one.  If you do not have insurance, you can go to the finance/registration department for more assistance.  Return to the ED immediately if you develop any of the following problems: -- Leaking urine or difficulty urinating; -- Inability to control your bowels; -- New numbness or weakness in your legs or numbness between your legs; -- Inability to walk -- Fever

## 2024-02-16 NOTE — ED Provider Notes (Signed)
 [facility] Provider Note    Event Date/Time   First MD Initiated Contact with Patient 02/15/24 [phone]     (approximate)   History   Back Pain   HPI Stephanie Franklin is a 53 y.o. female who presents for evaluation of low back pain on both sides that has been going on for about 5 days.  She said that she does a lot of bending and lifting at work and she thinks she pulled something.  She has no pain radiating down her legs.  It feels different from the usual hip bursitis that she deals with.  She says she has multiple chronic pain issues and is prescribed tramadol and oxycodone  that she takes regularly, but it does not help.  She has no numbness or weakness and the pain only happens when she bends over at the waist or strains with her back.  No urinary retention, urinary incontinence, nor bowel habit changes.  No burning when she urinates     Physical Exam   Triage Vital Signs: ED Triage Vitals [02/15/24 2130]  Encounter Vitals Group     BP 129/82     Girls Systolic BP Percentile      Girls Diastolic BP Percentile      Boys Systolic BP Percentile      Boys Diastolic BP Percentile      Pulse Rate 100     Resp (!) 22     Temp 98.2 F (36.8 C)     Temp Source Oral     SpO2 100 %     Weight 71.7 kg (158 lb)     Height 1.524 m (5')     Head Circumference      Peak Flow      Pain Score 10     Pain Loc      Pain Education      Exclude from Growth Chart     Most recent vital signs: Vitals:   02/15/24 2130  BP: 129/82  Pulse: 100  Resp: (!) 22  Temp: 98.2 F (36.8 C)  SpO2: 100%    General: Awake, alert, comfortable at rest, in pain when she moves around.  Pleasant and communicative. CV:  Good peripheral perfusion.  Resp:  Normal effort. Speaking easily and comfortably, no accessory muscle usage nor intercostal retractions.   Abd:  No distention.  Other:  The patient has no pain with straight leg raise of either side.  She has pain when  she tries to sit up and she has reproducible tenderness to palpation of the lumbar soft tissues/paraspinal muscles.  No point tenderness along the lumbar or thoracic spine.  No appreciable neurological deficits.   ED Results / Procedures / Treatments   Labs (all labs ordered are listed, but only abnormal results are displayed) Labs Reviewed  URINALYSIS, ROUTINE W REFLEX MICROSCOPIC - Abnormal; Notable for the following components:      Result Value   Color, Urine YELLOW (*)    APPearance HAZY (*)    Specific Gravity, Urine 1.033 (*)    Protein, ur 30 (*)    All other components within normal limits  BASIC METABOLIC PANEL WITH GFR - Abnormal; Notable for the following components:   Potassium 3.3 (*)    Glucose, Bld 120 (*)    Creatinine, Ser 1.11 (*)    Calcium 8.6 (*)    GFR, Estimated 59 (*)    All other components within normal limits  CBC - Abnormal;  Notable for the following components:   HCT 35.8 (*)    All other components within normal limits       PROCEDURES:  Critical Care performed: No  Procedures    IMPRESSION / MDM / ASSESSMENT AND PLAN / ED COURSE  I reviewed the triage vital signs and the nursing notes.                              Differential diagnosis includes, but is not limited to, lumbar strain, infection such as UTI/pyelonephritis, bursitis, sacroiliitis, sciatica, transverse myelitis, cauda equina syndrome, osteomyelitis.  Patient's presentation is most consistent with acute presentation with potential threat to life or bodily function.  Labs/studies ordered: Urinalysis, CBC, BMP  Interventions/Medications given:  Medications  lidocaine  (LIDODERM ) 5 % 2 patch (2 patches Transdermal Patch Applied 02/16/24 0043)  ketorolac  (TORADOL ) 30 MG/ML injection 30 mg (30 mg Intramuscular Given 02/16/24 0043)  cyclobenzaprine  (FLEXERIL ) tablet 5 mg (5 mg Oral Given 02/16/24 0043)    (Note:  hospital course my include additional interventions and/or  labs/studies not listed above.)   Labs are all stable and reassuring.  No obvious sign of infection and very low risk for bony injury.  No neurological deficits, no indication of emergent/surgical cause of her back pain.  Physical exam strongly suggest musculoskeletal lumbar strain.  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  I advised her to continue taking those as well as using the more conservative measures listed above and below.  She said that her doctor will do injections that have helped in the past and I encouraged her to follow-up on Monday.  We talked about how prednisone is unlikely to help but she thought it might to because it has in the past and I am willing to try it to see if it provides some assistance.  I gave her a one-time dose of Flexeril  but explained I was uncomfortable writing prescription for her given her other chronic medications and she said that she understands     Clinical Course as of 02/16/24 0052  Sat Feb 16, 2024  0021 Of note, I inadvertently clicked on the Admit button which is why a disposition/admission event shows up in the patient event log. [CF]    Clinical Course User Index [CF] Gordan Huxley, MD     FINAL CLINICAL IMPRESSION(S) / ED DIAGNOSES   Final diagnoses:  Strain of lumbar region, initial encounter     Rx / DC Orders   ED Discharge Orders          Ordered    lidocaine  (LIDODERM ) 5 %  Every 12 hours        02/16/24 0038    predniSONE (DELTASONE) 10 MG tablet        02/16/24 0038             Note:  This document was prepared using Dragon voice recognition software and may include unintentional dictation errors.   Gordan Huxley, MD 02/16/24 [phone]

## 2024-02-16 NOTE — ED Notes (Signed)
 Pt given DC instructions. Pt verbalized understanding of follow up care. Pt ambulatory from ED without difficulty.

## 2024-02-20 ENCOUNTER — Other Ambulatory Visit: Payer: Self-pay | Admitting: Family Medicine

## 2024-02-20 DIAGNOSIS — Z1231 Encounter for screening mammogram for malignant neoplasm of breast: Secondary | ICD-10-CM

## 2024-03-27 ENCOUNTER — Ambulatory Visit
Admission: RE | Admit: 2024-03-27 | Discharge: 2024-03-27 | Disposition: A | Payer: Self-pay | Source: Ambulatory Visit | Attending: Family Medicine | Admitting: Family Medicine

## 2024-03-27 DIAGNOSIS — Z1231 Encounter for screening mammogram for malignant neoplasm of breast: Secondary | ICD-10-CM | POA: Insufficient documentation
